# Patient Record
Sex: Female | Born: 1975 | Race: White | Hispanic: No | Smoking: Current some day smoker
Health system: Southern US, Community
[De-identification: ages and names within clinical notes are randomized; demographics above are authoritative.]

## PROBLEM LIST (undated history)

## (undated) DIAGNOSIS — J45909 Unspecified asthma, uncomplicated: Secondary | ICD-10-CM

## (undated) DIAGNOSIS — F419 Anxiety disorder, unspecified: Secondary | ICD-10-CM

## (undated) HISTORY — PX: ABDOMINAL HYSTERECTOMY: SHX81

---

## 1997-10-25 ENCOUNTER — Inpatient Hospital Stay (HOSPITAL_COMMUNITY): Admission: AD | Admit: 1997-10-25 | Discharge: 1997-10-25 | Payer: Self-pay | Admitting: Family Medicine

## 1997-12-14 ENCOUNTER — Inpatient Hospital Stay (HOSPITAL_COMMUNITY): Admission: AD | Admit: 1997-12-14 | Discharge: 1997-12-16 | Payer: Self-pay | Admitting: Family Medicine

## 1999-02-07 ENCOUNTER — Other Ambulatory Visit: Admission: RE | Admit: 1999-02-07 | Discharge: 1999-02-07 | Payer: Self-pay | Admitting: Family Medicine

## 2000-02-27 ENCOUNTER — Other Ambulatory Visit: Admission: RE | Admit: 2000-02-27 | Discharge: 2000-02-27 | Payer: Self-pay | Admitting: Family Medicine

## 2000-03-09 ENCOUNTER — Other Ambulatory Visit: Admission: RE | Admit: 2000-03-09 | Discharge: 2000-03-09 | Payer: Self-pay | Admitting: *Deleted

## 2000-03-09 ENCOUNTER — Encounter (INDEPENDENT_AMBULATORY_CARE_PROVIDER_SITE_OTHER): Payer: Self-pay

## 2000-03-25 ENCOUNTER — Ambulatory Visit (HOSPITAL_COMMUNITY): Admission: RE | Admit: 2000-03-25 | Discharge: 2000-03-25 | Payer: Self-pay | Admitting: *Deleted

## 2000-03-25 ENCOUNTER — Encounter (INDEPENDENT_AMBULATORY_CARE_PROVIDER_SITE_OTHER): Payer: Self-pay

## 2000-04-19 ENCOUNTER — Inpatient Hospital Stay (HOSPITAL_COMMUNITY): Admission: AD | Admit: 2000-04-19 | Discharge: 2000-04-19 | Payer: Self-pay | Admitting: *Deleted

## 2005-07-01 ENCOUNTER — Ambulatory Visit: Payer: Self-pay | Admitting: Family Medicine

## 2005-09-14 ENCOUNTER — Ambulatory Visit: Payer: Self-pay | Admitting: Family Medicine

## 2006-03-12 ENCOUNTER — Ambulatory Visit: Payer: Self-pay | Admitting: Family Medicine

## 2007-04-19 ENCOUNTER — Encounter: Admission: RE | Admit: 2007-04-19 | Discharge: 2007-04-19 | Payer: Self-pay | Admitting: Obstetrics & Gynecology

## 2011-01-09 NOTE — Op Note (Signed)
[facility]  Patient:    XOIE, KREUSER                      MRN: [id number] Proc. Date: 03/25/00 Adm. Date:  [id number] Disc. Date: [id number] Attending:  Pleas Koch CC:         Gweneth Dimitri, M.D. at [facility]                           Operative Report  PREOPERATIVE DIAGNOSIS:       High grade cervical dysplasia.  POSTOPERATIVE DIAGNOSIS:      High grade cervical dysplasia.  OPERATION:                    LEEP with extra-surgical excision procedure.  SURGEON:                      Georgina Peer, M.D.  ANESTHESIA:                   General.  COMPLICATIONS:                None.  ESTIMATED BLOOD LOSS:         Less than 20 cc.  INDICATIONS:                  A 35 year old gravida 3, para 2, with abnormal Pap smear showing suggested high grade dysplasia.  High grade dysplasia was found on colposcopy and biopsy, and the patient is presenting for treatment with LEEP procedure.  DESCRIPTION OF PROCEDURE:     The patient was taken to the operating room and chose general anesthetic.  She was given a general anesthetic and placed in the dorsolithotomy position.  The cervix was visualized, ________ areas at 3 and 6 previously identified in the office were seen in their similar location.  A total of 4 cc of 2% Xylocaine with epinephrine, 1:100,000 was injected intracervically, the cervix blanched.  A ________ with the setting of 100 watts cutting, 60 watts coag was used to excise the lesion in transformation zone.  A ______ cautery was used to cauterize the edges and the specimen was marked appropriately and sent to pathology.                                The patient was awakened and transferred to recovery in good condition.  Sponge, needle, and instrument counts were correct. DD:  03/25/00 TD:  03/27/00 Job: [id number]

## 2013-12-13 ENCOUNTER — Emergency Department (HOSPITAL_COMMUNITY): Payer: BC Managed Care – PPO

## 2013-12-13 ENCOUNTER — Encounter (HOSPITAL_COMMUNITY): Payer: Self-pay | Admitting: Emergency Medicine

## 2013-12-13 ENCOUNTER — Emergency Department (HOSPITAL_COMMUNITY)
Admission: EM | Admit: 2013-12-13 | Discharge: 2013-12-13 | Disposition: A | Discharge status: Home / Self Care | Payer: BC Managed Care – PPO | Attending: Emergency Medicine | Admitting: Emergency Medicine

## 2013-12-13 DIAGNOSIS — IMO0002 Reserved for concepts with insufficient information to code with codable children: Secondary | ICD-10-CM | POA: Insufficient documentation

## 2013-12-13 DIAGNOSIS — Z8659 Personal history of other mental and behavioral disorders: Secondary | ICD-10-CM | POA: Diagnosis not present

## 2013-12-13 DIAGNOSIS — Y9389 Activity, other specified: Secondary | ICD-10-CM | POA: Diagnosis not present

## 2013-12-13 DIAGNOSIS — Y92410 Unspecified street and highway as the place of occurrence of the external cause: Secondary | ICD-10-CM | POA: Insufficient documentation

## 2013-12-13 DIAGNOSIS — F17200 Nicotine dependence, unspecified, uncomplicated: Secondary | ICD-10-CM | POA: Diagnosis not present

## 2013-12-13 DIAGNOSIS — J45909 Unspecified asthma, uncomplicated: Secondary | ICD-10-CM | POA: Insufficient documentation

## 2013-12-13 DIAGNOSIS — S0993XA Unspecified injury of face, initial encounter: Secondary | ICD-10-CM | POA: Diagnosis not present

## 2013-12-13 DIAGNOSIS — S199XXA Unspecified injury of neck, initial encounter: Secondary | ICD-10-CM

## 2013-12-13 HISTORY — DX: Anxiety disorder, unspecified: F41.9

## 2013-12-13 HISTORY — DX: Unspecified asthma, uncomplicated: J45.909
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
## 2013-12-13 MED ORDER — IBUPROFEN 800 MG PO TABS
800.0000 mg | ORAL_TABLET | Freq: Three times a day (TID) | ORAL | Status: AC
Start: 2013-12-13 — End: ?

## 2013-12-13 MED ORDER — HYDROCODONE-ACETAMINOPHEN 5-325 MG PO TABS: 1.0000 | ORAL_TABLET | Freq: Four times a day (QID) | ORAL | Status: DC | PRN

## 2013-12-13 MED ORDER — IBUPROFEN 800 MG PO TABS
800.0000 mg | ORAL_TABLET | Freq: Once | ORAL | Status: AC
  Administered 2013-12-13: 800 mg via ORAL
  Filled 2013-12-13: qty 1

## 2013-12-13 MED ORDER — HYDROCODONE-ACETAMINOPHEN 5-325 MG PO TABS: 1.0000 | ORAL_TABLET | Freq: Once | ORAL | Status: DC

## 2013-12-13 MED ORDER — HYDROCODONE-ACETAMINOPHEN 5-325 MG PO TABS
1.0000 | ORAL_TABLET | Freq: Once | ORAL | Status: AC
  Administered 2013-12-13: 1 via ORAL
  Filled 2013-12-13: qty 1

## 2013-12-13 NOTE — ED Notes (Signed)
Pt reports pain 8/10.  Denies any relief from ibuprofen.  Pt request something stronger for pain.  Notified edp
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
## 2016-05-29 DIAGNOSIS — F17210 Nicotine dependence, cigarettes, uncomplicated: Secondary | ICD-10-CM | POA: Insufficient documentation

## 2017-06-06 DIAGNOSIS — M7502 Adhesive capsulitis of left shoulder: Secondary | ICD-10-CM | POA: Insufficient documentation

## 2019-06-27 DIAGNOSIS — G44229 Chronic tension-type headache, not intractable: Secondary | ICD-10-CM | POA: Insufficient documentation

## 2020-08-26 DIAGNOSIS — F4310 Post-traumatic stress disorder, unspecified: Secondary | ICD-10-CM | POA: Insufficient documentation

## 2020-10-07 DIAGNOSIS — M545 Low back pain, unspecified: Secondary | ICD-10-CM | POA: Insufficient documentation

## 2020-12-24 ENCOUNTER — Other Ambulatory Visit: Payer: Self-pay | Admitting: Orthopedic Surgery

## 2020-12-24 ENCOUNTER — Other Ambulatory Visit: Payer: Self-pay | Admitting: Family Medicine

## 2020-12-24 DIAGNOSIS — M5459 Other low back pain: Secondary | ICD-10-CM

## 2020-12-25 ENCOUNTER — Telehealth: Payer: Self-pay

## 2020-12-25 NOTE — Progress Notes (Signed)
Phone call to patient to discuss order for intradiscal injection. Reviewed pts allergies and most recent weight. Pt was advised not to take oral antibiotics for this procedure that we would give her IV antibiotics the day of her procedure. Discharge instructions also reviewed with patient and instructed patient to have a driver the day of the procedure. Pt verbalized understanding.

## 2020-12-26 ENCOUNTER — Other Ambulatory Visit: Payer: Self-pay | Admitting: Orthopedic Surgery

## 2020-12-26 ENCOUNTER — Ambulatory Visit
Admission: RE | Admit: 2020-12-26 | Discharge: 2020-12-26 | Disposition: A | Discharge status: Home / Self Care | Payer: Self-pay | Source: Ambulatory Visit | Attending: Orthopedic Surgery | Admitting: Orthopedic Surgery

## 2020-12-26 DIAGNOSIS — R52 Pain, unspecified: Secondary | ICD-10-CM

## 2020-12-27 ENCOUNTER — Ambulatory Visit
Admission: RE | Admit: 2020-12-27 | Discharge: 2020-12-27 | Disposition: A | Discharge status: Home / Self Care | Payer: Medicaid Other | Source: Ambulatory Visit | Attending: Orthopedic Surgery | Admitting: Orthopedic Surgery

## 2020-12-27 ENCOUNTER — Other Ambulatory Visit: Payer: Self-pay

## 2020-12-27 DIAGNOSIS — M5459 Other low back pain: Secondary | ICD-10-CM
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
## 2020-12-27 MED ORDER — IOPAMIDOL (ISOVUE-M 200) INJECTION 41%
1.0000 mL | Freq: Once | INTRAMUSCULAR | Status: AC
  Administered 2020-12-27: 1 mL

## 2020-12-27 MED ORDER — CEFAZOLIN SODIUM-DEXTROSE 2-4 GM/100ML-% IV SOLN
2.0000 g | INTRAVENOUS | Status: AC
  Administered 2020-12-27: 2 g via INTRAVENOUS

## 2020-12-27 NOTE — Discharge Instructions (Signed)

## 2021-02-06 ENCOUNTER — Other Ambulatory Visit: Payer: Self-pay

## 2021-02-06 ENCOUNTER — Ambulatory Visit: Payer: Medicaid Other | Attending: Orthopedic Surgery

## 2021-02-06 DIAGNOSIS — G8929 Other chronic pain: Secondary | ICD-10-CM | POA: Diagnosis present

## 2021-02-06 DIAGNOSIS — M545 Low back pain, unspecified: Secondary | ICD-10-CM | POA: Insufficient documentation

## 2021-02-06 DIAGNOSIS — M5416 Radiculopathy, lumbar region: Secondary | ICD-10-CM
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
## 2021-02-17 ENCOUNTER — Ambulatory Visit: Payer: Medicaid Other

## 2021-02-17 ENCOUNTER — Other Ambulatory Visit: Payer: Self-pay

## 2021-02-17 DIAGNOSIS — M5416 Radiculopathy, lumbar region: Secondary | ICD-10-CM

## 2021-02-17 DIAGNOSIS — M545 Low back pain, unspecified: Secondary | ICD-10-CM | POA: Diagnosis not present

## 2021-02-17 DIAGNOSIS — G8929 Other chronic pain: Secondary | ICD-10-CM

## 2021-02-17 NOTE — Therapy (Signed)
[facility] [address] [place], [place], [address] Phone: [phone]   Fax:  [phone]  Physical Therapy Treatment  Patient Details  Name: Jermeka Schlotterbeck MRN: [id number] Date of Birth: [birth year] Referring Provider (PT): Venita Lick   Encounter Date: 02/17/2021   PT End of Session - 02/17/21 0814     Visit Number 2    Number of Visits 8    Date for PT Re-Evaluation 03/06/21    PT Start Time 0734    PT Stop Time 0820    PT Time Calculation (min) 46 min    Activity Tolerance Patient tolerated treatment well;No increased pain    Behavior During Therapy WFL for tasks assessed/performed             Past Medical History:  Diagnosis Date   Anxiety    Asthma     Past Surgical History:  Procedure Laterality Date   ABDOMINAL HYSTERECTOMY      There were no vitals filed for this visit.   Subjective Assessment - 02/17/21 0740     Subjective COVID-19 screening performed upon arrival. She reports her back pain is an 8/10 today. She was given pain medication as she was in a lot o f pain last week.    Limitations Sitting;Walking;Standing    How long can you sit comfortably? 5 mins has to get up and walk around;    How long can you stand comfortably? 20 mins or less    How long can you walk comfortably? 30 mins    Patient Stated Goals To be able to sit and stand comfortably without pain    Currently in Pain? Yes    Pain Score 8     Pain Location Back                               [facility] Adult PT Treatment/Exercise - 02/17/21 0734       Exercises   Exercises Lumbar      Lumbar Exercises: Stretches   Passive Hamstring Stretch Limitations Supine with yofa streap 3 x 20 sec bil    Double Knee to Chest Stretch Limitations 5 x 10 sec holding at knees with hands    Lower Trunk Rotation Limitations 2 mins - pt reported improved ease of movement    Quadruped Mid Back Stretch Limitations child's pose to  thread the needle 5 x 10 sec    Prone Mid Back Stretch Limitations press up through hands 3 x 10 sec    Figure 4 Stretch Limitations supine push pull hold 10 sec; 10x each      Lumbar Exercises: Quadruped   Other Quadruped Lumbar Exercises primal push up - raised knees off of the mat x 10      Modalities   Modalities Moist Heat      Manual Therapy   Manual Therapy Joint mobilization;Soft tissue mobilization    Manual therapy comments reduced lumbopelvic restrictions    Joint Mobilization CPA gr II at lumbar spine, R innominate inf/lat mob gr III    Soft tissue mobilization QL STM, glut max/med STM, lumbar paraspinal STM                      PT Short Term Goals - 02/07/21 0941       PT SHORT TERM GOAL #1   Title Indep With HEP    Baseline Not knowledgeable about HEP  Time 1    Period Weeks    Status New    Target Date 02/13/21               PT Long Term Goals - 02/07/21 0942       PT LONG TERM GOAL #1   Title Patient will be indep with an advanced HEP    Baseline Initial HEP provided; indep to be assessed for STG.    Time 3    Period Weeks    Status New    Target Date 02/27/21      PT LONG TERM GOAL #2   Title Patient will be able to perform bed mobility with minimal difficulty.    Baseline Pain occurs and increased time needed for transfers    Time 4    Period Weeks    Status New    Target Date 03/06/21      PT LONG TERM GOAL #3   Title Patient will be able to sit greater than 30 mins without needing to change positions and without pain greater than 2/10    Baseline 10 mins pain 8/10    Time 4    Period Weeks    Status New    Target Date 03/06/21                   Plan - 02/17/21 0814     Clinical Impression Statement Patient participated well i n PT session today with improved mobility of the lumbopelvic region wihtout increased symptoms. She was able to perform HEP and indicate releief areas with activity. Plan to progres  score strength as care of episode continues.    Personal Factors and Comorbidities Past/Current Experience;Time since onset of injury/illness/exacerbation    Examination-Activity Limitations Bed Mobility;Stand;Lift;Sit    Examination-Participation Restrictions Driving;Community Activity    Stability/Clinical Decision Making Evolving/Moderate complexity    Clinical Decision Making Low    Rehab Potential Good    PT Frequency 2x / week    PT Duration 4 weeks    PT Treatment/Interventions Moist Heat;Ultrasound;Therapeutic activities;Neuromuscular re-education;Therapeutic exercise;Manual techniques;Joint Manipulations;Passive range of motion    PT Next Visit Plan core strength in quadruped, standing (multifidi pressout), pelvic clocks    Consulted and Agree with Plan of Care Patient             Patient will benefit from skilled therapeutic intervention in order to improve the following deficits and impairments:  Decreased activity tolerance, Decreased range of motion, Decreased strength, Hypomobility, Increased fascial restricitons, Increased muscle spasms, Pain, Decreased mobility  Visit Diagnosis: Chronic right-sided low back pain, unspecified whether sciatica present  Radiculopathy, lumbar region     Problem List There are no problems to display for this patient.   Levonne Spiller PT, DPT 02/17/2021, 8:22 AM  [facility] [address] [place], [place], [address] Phone: [phone]   Fax:  [phone]  Name: Catera Hankins MRN: [id number] Date of Birth: April 22, 1976

## 2021-02-19 ENCOUNTER — Ambulatory Visit: Payer: Medicaid Other

## 2021-02-19 ENCOUNTER — Other Ambulatory Visit: Payer: Self-pay

## 2021-02-19 DIAGNOSIS — M5416 Radiculopathy, lumbar region: Secondary | ICD-10-CM

## 2021-02-19 DIAGNOSIS — M545 Low back pain, unspecified: Secondary | ICD-10-CM | POA: Diagnosis not present

## 2021-02-19 DIAGNOSIS — G8929 Other chronic pain: Secondary | ICD-10-CM

## 2021-02-19 NOTE — Therapy (Signed)
[facility] [address] [place], [place], [address] Phone: [phone]   Fax:  [phone]  Physical Therapy Treatment  Patient Details  Name: Christina Murillo MRN: [id number] Date of Birth: 16-Feb-1976 Referring Provider (PT): Venita Lick   Encounter Date: 02/19/2021   PT End of Session - 02/19/21 0812     Visit Number 3    Number of Visits 8    Date for PT Re-Evaluation 03/06/21    PT Start Time 0732    Activity Tolerance Patient tolerated treatment well;No increased pain    Behavior During Therapy WFL for tasks assessed/performed             Past Medical History:  Diagnosis Date   Anxiety    Asthma     Past Surgical History:  Procedure Laterality Date   ABDOMINAL HYSTERECTOMY      There were no vitals filed for this visit.   Subjective Assessment - 02/19/21 0741     Subjective COVID-19 screening performed upon arrival. She states that she is sore today in the hip and low back. She states that none of the exercises she does at home are helping. She states that when she lays on her elbows she feels a little stretch but when she presses up through her hands all the way the strain is way too much.    Limitations Sitting;Walking;Standing    How long can you sit comfortably? 5 mins has to get up and walk around;    How long can you stand comfortably? 20 mins or less    How long can you walk comfortably? 30 mins    Patient Stated Goals To be able to sit and stand comfortably without pain    Currently in Pain? Yes    Pain Score 8     Pain Location Back    Pain Orientation Left    Pain Descriptors / Indicators Aching;Nagging    Pain Type Acute pain    Pain Radiating Towards Left hip    Pain Onset More than a month ago    Pain Frequency Several days a week    Aggravating Factors  prolonged walkng    Multiple Pain Sites Yes    Pain Score 8    Pain Location Hip    Pain Descriptors / Indicators Shooting                 [facility] Adult PT Treatment/Exercise - 02/19/21 0732       Exercises   Exercises Lumbar      Lumbar Exercises: Stretches   Other Lumbar Stretch Exercise standing hip flexor stretch with L leg retro placed on mat 5 x 10 sec holds    Other Lumbar Stretch Exercise hip flexor stretch - modified thomas at edge of plinth      Lumbar Exercises: Aerobic   Nustep 8 mins lvl 2.0      Lumbar Exercises: Standing   Other Standing Lumbar Exercises posterior pelvic tilt at the wall x 2 mins      Electrical Stimulation   Electrical Stimulation Location low back    Electrical Stimulation Action IFC    Electrical Stimulation Parameters 10.5 - 12  CV ; 15 mins    Electrical Stimulation Goals Pain               PT Short Term Goals - 02/07/21 0941       PT SHORT TERM GOAL #1   Title Indep With HEP  Baseline Not knowledgeable about HEP    Time 1    Period Weeks    Status New    Target Date 02/13/21               PT Long Term Goals - 02/07/21 0942       PT LONG TERM GOAL #1   Title Patient will be indep with an advanced HEP    Baseline Initial HEP provided; indep to be assessed for STG.    Time 3    Period Weeks    Status New    Target Date 02/27/21      PT LONG TERM GOAL #2   Title Patient will be able to perform bed mobility with minimal difficulty.    Baseline Pain occurs and increased time needed for transfers    Time 4    Period Weeks    Status New    Target Date 03/06/21      PT LONG TERM GOAL #3   Title Patient will be able to sit greater than 30 mins without needing to change positions and without pain greater than 2/10    Baseline 10 mins pain 8/10    Time 4    Period Weeks    Status New    Target Date 03/06/21                   Plan - 02/19/21 1610     Clinical Impression Statement Patient participates well in PT with improvement in lumbopelvoc mobility noted with pelvic tilts on physioball. Patient reports pain centralizes to center  low back and reduced lateral hip pain noted with standing hip flexor stretch. Provided HEP to encourage hip mobility within pain fre ranges. Skilled PT recommended to continue per plan of care.    Personal Factors and Comorbidities Past/Current Experience;Time since onset of injury/illness/exacerbation    Examination-Participation Restrictions Driving;Community Activity    Stability/Clinical Decision Making Evolving/Moderate complexity    Clinical Decision Making Low    Rehab Potential Good    PT Frequency 2x / week    PT Duration 4 weeks    PT Treatment/Interventions Moist Heat;Ultrasound;Therapeutic activities;Neuromuscular re-education;Therapeutic exercise;Manual techniques;Joint Manipulations;Passive range of motion;Electrical Stimulation    PT Next Visit Plan core strength in quadruped, standing (multifidi pressout), pelvic clocks; modalities as needed    Consulted and Agree with Plan of Care Patient             Patient will benefit from skilled therapeutic intervention in order to improve the following deficits and impairments:  Decreased activity tolerance, Decreased range of motion, Decreased strength, Hypomobility, Increased fascial restricitons, Increased muscle spasms, Pain, Decreased mobility  Visit Diagnosis: Chronic right-sided low back pain, unspecified whether sciatica present  Radiculopathy, lumbar region     Problem List There are no problems to display for this patient.   Levonne Spiller PT, DPT  02/19/2021, 9:00 AM  [facility] [address] [place], [place], [address] Phone: [phone]   Fax:  [phone]  Name: Christina Murillo MRN: [id number] Date of Birth: [birth year]

## 2021-02-25 ENCOUNTER — Other Ambulatory Visit: Payer: Self-pay | Admitting: Orthopedic Surgery

## 2021-02-25 DIAGNOSIS — M545 Low back pain, unspecified: Secondary | ICD-10-CM

## 2021-02-26 ENCOUNTER — Ambulatory Visit: Payer: Medicaid Other | Attending: Orthopedic Surgery

## 2021-02-26 ENCOUNTER — Other Ambulatory Visit: Payer: Self-pay

## 2021-02-26 DIAGNOSIS — M545 Low back pain, unspecified: Secondary | ICD-10-CM | POA: Insufficient documentation

## 2021-02-26 DIAGNOSIS — M5416 Radiculopathy, lumbar region: Secondary | ICD-10-CM | POA: Diagnosis present

## 2021-02-26 DIAGNOSIS — G8929 Other chronic pain: Secondary | ICD-10-CM | POA: Diagnosis present

## 2021-02-26 NOTE — Therapy (Signed)
[facility] [address] [place], [place], [address] Phone: [phone]   Fax:  [phone]  Physical Therapy Treatment  Patient Details  Name: Christina Murillo MRN: [id number] Date of Birth: 10/30/1975 Referring Provider (PT): Venita Lick   Encounter Date: 02/26/2021   PT End of Session - 02/26/21 0827     Visit Number 4    Number of Visits 8    Date for PT Re-Evaluation 03/06/21    PT Start Time 0736    PT Stop Time 0815    PT Time Calculation (min) 39 min    Activity Tolerance Patient tolerated treatment well;No increased pain    Behavior During Therapy WFL for tasks assessed/performed             Past Medical History:  Diagnosis Date   Anxiety    Asthma     Past Surgical History:  Procedure Laterality Date   ABDOMINAL HYSTERECTOMY      There were no vitals filed for this visit.   Subjective Assessment - 02/26/21 0751     Subjective COVID-19 screening performed upon arrival. She states that she is sore in the low back and L side - she has plans to receive injections but is awaiting a follow up call. She states that she has reecieved much relief with TENS last visit.    Limitations Sitting;Walking;Standing    How long can you sit comfortably? 5 mins has to get up and walk around;    Patient Stated Goals To be able to sit and stand comfortably without pain    Currently in Pain? Yes    Pain Score 7     Pain Location Back    Pain Orientation Left    Pain Descriptors / Indicators Sharp;Aching;Jabbing    Pain Radiating Towards Left hip anterior L leg    Pain Onset More than a month ago                               [facility] Adult PT Treatment/Exercise - 02/26/21 0737       Lumbar Exercises: Stretches   Other Lumbar Stretch Exercise physiobal LTR x 20 ; DKTC x 20;      Lumbar Exercises: Supine   Clam Limitations Single knee fall out x 20 each    Bent Knee Raise Limitations reverse curls  physioball lifts with LE in hooklying x 15    Large Ball Abdominal Isometric Limitations physioball DKTC with hip bridge x 15 to encourae core strength    Other Supine Lumbar Exercises PPT with feet elevated on physioball      Modalities   Modalities Electrical Stimulation      Electrical Stimulation   Electrical Stimulation Location Low Back    Electrical Stimulation Action IFC    Electrical Stimulation Parameters 80 - 150Hz  ; 40 % scan; 11                      PT Short Term Goals - 02/07/21 0941       PT SHORT TERM GOAL #1   Title Indep With HEP    Baseline Not knowledgeable about HEP    Time 1    Period Weeks    Status New    Target Date 02/13/21               PT Long Term Goals - 02/07/21 02/09/21  PT LONG TERM GOAL #1   Title Patient will be indep with an advanced HEP    Baseline Initial HEP provided; indep to be assessed for STG.    Time 3    Period Weeks    Status New    Target Date 02/27/21      PT LONG TERM GOAL #2   Title Patient will be able to perform bed mobility with minimal difficulty.    Baseline Pain occurs and increased time needed for transfers    Time 4    Period Weeks    Status New    Target Date 03/06/21      PT LONG TERM GOAL #3   Title Patient will be able to sit greater than 30 mins without needing to change positions and without pain greater than 2/10    Baseline 10 mins pain 8/10    Time 4    Period Weeks    Status New    Target Date 03/06/21                   Plan - 02/26/21 6213     Clinical Impression Statement Patient tolerates therapeutic interventions well with emphasis on pain modulation and mobility. Patient able to reduce hip and back pain at end of session with heat pad remaining at low back with supine activities. Skin assessed, normal response noted with modalities. Therapist provided written handout for patient to assess purchase of TENS unit for home. Skilled PT recommended to continue per  plan of care t oimprove sitting and standing tolerance.    Personal Factors and Comorbidities Past/Current Experience;Time since onset of injury/illness/exacerbation    Examination-Activity Limitations Bed Mobility;Stand;Lift;Sit    Examination-Participation Restrictions Driving;Community Activity    Stability/Clinical Decision Making Evolving/Moderate complexity    Clinical Decision Making Low    Rehab Potential Good    PT Frequency 2x / week    PT Duration 4 weeks    PT Treatment/Interventions Moist Heat;Ultrasound;Therapeutic activities;Neuromuscular re-education;Therapeutic exercise;Manual techniques;Joint Manipulations;Passive range of motion;Electrical Stimulation    PT Next Visit Plan core strength in quadruped, standing (multifidi pressout), pelvic clocks; modalities as needed    Consulted and Agree with Plan of Care Patient             Patient will benefit from skilled therapeutic intervention in order to improve the following deficits and impairments:  Decreased activity tolerance, Decreased range of motion, Decreased strength, Hypomobility, Increased fascial restricitons, Increased muscle spasms, Pain, Decreased mobility  Visit Diagnosis: Chronic right-sided low back pain, unspecified whether sciatica present  Radiculopathy, lumbar region     Problem List Patient Active Problem List   Diagnosis Date Noted   Low back pain 10/07/2020   PTSD (post-traumatic stress disorder) 08/26/2020   Chronic tension-type headache, not intractable 06/27/2019   Adhesive capsulitis of left shoulder 06/06/2017   Cigarette nicotine dependence without complication 05/29/2016   GAD (generalized anxiety disorder) 08/03/2013    Levonne Spiller PT, DPT 02/26/2021, 8:46 AM  [facility] [address] [place], [place], [address] Phone: [phone]   Fax:  [phone]  Name: Christina Murillo MRN: [id number] Date of Birth: 1976/02/19

## 2021-02-27 ENCOUNTER — Other Ambulatory Visit: Payer: Self-pay

## 2021-02-27 ENCOUNTER — Ambulatory Visit: Payer: Medicaid Other

## 2021-02-27 DIAGNOSIS — G8929 Other chronic pain: Secondary | ICD-10-CM

## 2021-02-27 DIAGNOSIS — M545 Low back pain, unspecified: Secondary | ICD-10-CM | POA: Diagnosis not present

## 2021-02-27 DIAGNOSIS — M5416 Radiculopathy, lumbar region: Secondary | ICD-10-CM

## 2021-02-27 NOTE — Therapy (Signed)
[facility] [address] [place], [place], [address] Phone: [phone]   Fax:  [phone]  Physical Therapy Treatment  Patient Details  Name: Christina Murillo MRN: [id number] Date of Birth: 11-08-1975 Referring Provider (PT): Venita Lick   Encounter Date: 02/27/2021   PT End of Session - 02/27/21 0900     Visit Number 5    Number of Visits 8    Date for PT Re-Evaluation 03/06/21    PT Start Time 0735   Patient late arrival   PT Stop Time 0816    PT Time Calculation (min) 41 min    Activity Tolerance Patient tolerated treatment well;No increased pain    Behavior During Therapy WFL for tasks assessed/performed             Past Medical History:  Diagnosis Date   Anxiety    Asthma     Past Surgical History:  Procedure Laterality Date   ABDOMINAL HYSTERECTOMY      There were no vitals filed for this visit.   Subjective Assessment - 02/27/21 0854     Subjective COVID-19 screening performed upon arrival. She states that she is sore today - the pain gets worse in the middle of the day. She felt better after last session.    Limitations Sitting;Walking;Standing    How long can you sit comfortably? 5 mins has to get up and walk around;    How long can you stand comfortably? 20 mins or less    How long can you walk comfortably? 30 mins    Patient Stated Goals To be able to sit and stand comfortably without pain    Currently in Pain? Yes    Pain Score 6     Pain Location Back                               [facility] Adult PT Treatment/Exercise - 02/27/21 0001       Exercises   Exercises Lumbar      Lumbar Exercises: Stretches   Active Hamstring Stretch 5 reps;10 seconds    Active Hamstring Stretch Limitations seated    Hip Flexor Stretch 5 reps;10 seconds    Hip Flexor Stretch Limitations standing with foot on chair extended    Pelvic Tilt 3 reps;10 seconds    Pelvic Tilt Limitations supine with  physical tactile cues from self    Standing Extension 10 reps    Standing Extension Limitations hips to wall with L UE reach overhead    Figure 4 Stretch 5 reps;20 seconds    Figure 4 Stretch Limitations seated    Other Lumbar Stretch Exercise LTR - x 2 mins with contra reach      Lumbar Exercises: Aerobic   Stationary Bike 10 mins with Estime placed at low back      Lumbar Exercises: Supine   AB Set Limitations knees to chest / reverse crunch      Modalities   Modalities Geologist, engineering Location Low Back    Electrical Stimulation Action TENS    Electrical Stimulation Parameters 2-4 Hz; ;    Electrical Stimulation Goals Pain      Manual Therapy   Manual Therapy Joint mobilization;Soft tissue mobilization    Manual therapy comments improve L sided hip mobility \    Joint Mobilization CPA gr II at lumbar spine, R innominate inf/lat mob  gr III    Soft tissue mobilization STM to QL and L lumbar paraspinal                      PT Short Term Goals - 02/07/21 0941       PT SHORT TERM GOAL #1   Title Indep With HEP    Baseline Not knowledgeable about HEP    Time 1    Period Weeks    Status New    Target Date 02/13/21               PT Long Term Goals - 02/07/21 0942       PT LONG TERM GOAL #1   Title Patient will be indep with an advanced HEP    Baseline Initial HEP provided; indep to be assessed for STG.    Time 3    Period Weeks    Status New    Target Date 02/27/21      PT LONG TERM GOAL #2   Title Patient will be able to perform bed mobility with minimal difficulty.    Baseline Pain occurs and increased time needed for transfers    Time 4    Period Weeks    Status New    Target Date 03/06/21      PT LONG TERM GOAL #3   Title Patient will be able to sit greater than 30 mins without needing to change positions and without pain greater than 2/10    Baseline 10 mins pain 8/10     Time 4    Period Weeks    Status New    Target Date 03/06/21                   Plan - 02/27/21 0901     Clinical Impression Statement Patient participates well in PT with benenfit of stretches of L hip flexor and lumbar region. Pain modulation with TENS unit provided today. Patient  educated on need to continue stretches provided for HEP. Skilled PT recommended to continue at this time.    Personal Factors and Comorbidities Past/Current Experience;Time since onset of injury/illness/exacerbation    Examination-Activity Limitations Bed Mobility;Stand;Lift;Sit    Examination-Participation Restrictions Driving;Community Activity    Stability/Clinical Decision Making Evolving/Moderate complexity    Rehab Potential Good    PT Frequency 2x / week    PT Duration 4 weeks    PT Treatment/Interventions Moist Heat;Ultrasound;Therapeutic activities;Neuromuscular re-education;Therapeutic exercise;Manual techniques;Joint Manipulations;Passive range of motion;Electrical Stimulation    PT Next Visit Plan core strength in quadruped, standing (multifidi pressout), pelvic clocks; modalities as needed             Patient will benefit from skilled therapeutic intervention in order to improve the following deficits and impairments:  Decreased activity tolerance, Decreased range of motion, Decreased strength, Hypomobility, Increased fascial restricitons, Increased muscle spasms, Pain, Decreased mobility  Visit Diagnosis: Chronic right-sided low back pain, unspecified whether sciatica present  Radiculopathy, lumbar region     Problem List Patient Active Problem List   Diagnosis Date Noted   Low back pain 10/07/2020   PTSD (post-traumatic stress disorder) 08/26/2020   Chronic tension-type headache, not intractable 06/27/2019   Adhesive capsulitis of left shoulder 06/06/2017   Cigarette nicotine dependence without complication 05/29/2016   GAD (generalized anxiety disorder) 08/03/2013     Levonne Spiller PT, DPT 02/27/2021, 9:11 AM  [facility] [address] [place], [place], [address] Phone: [phone]  Fax:  [phone]  Name: Christina Murillo MRN: [id number] Date of Birth: 1976/06/25

## 2021-03-03 ENCOUNTER — Ambulatory Visit: Payer: Medicaid Other | Admitting: Physical Therapy

## 2021-03-06 ENCOUNTER — Ambulatory Visit: Payer: Medicaid Other | Admitting: Physical Therapy

## 2021-03-06 ENCOUNTER — Other Ambulatory Visit: Payer: Self-pay

## 2021-03-06 DIAGNOSIS — M545 Low back pain, unspecified: Secondary | ICD-10-CM | POA: Diagnosis not present

## 2021-03-06 DIAGNOSIS — M5416 Radiculopathy, lumbar region: Secondary | ICD-10-CM

## 2021-03-06 DIAGNOSIS — G8929 Other chronic pain: Secondary | ICD-10-CM

## 2021-03-06 NOTE — Patient Instructions (Signed)
Pelvic Tilt: Posterior - Legs Bent (Supine)  Tighten stomach and flatten back by rolling pelvis down. Hold _10___ seconds. Relax. Repeat _10-30___ times per set. Do __2__ sets per session. Do _2___ sessions per day.   Bent Leg Lift (Hook-Lying)  Tighten stomach and slowly raise right leg _5___ inches from floor. Keep trunk rigid. Hold _3___ seconds. Repeat _10___ times per set. Do ___2-3_ sets per session. Do __2__ sessions per day.   Straight Leg Raise  Tighten stomach and slowly raise locked right leg __4__ inches from floor. Repeat __10-30__ times per set. Do __2__ sets per session. Do __2__ sessions per day.  Bridging  Slowly raise buttocks from floor, keeping stomach tight. Repeat _10___ times per set. Do __2__ sets per session. Do __2__ sessions per day.     Scapular Retraction: Bilateral     Standing lat pull with theraband  Anchor bands higher than your head. Start with your arms straight out in front of you at shoulder height (or a little above).  Pull bands down next to your body and then slowly return to the starting position.  10-30 x1day  Brushing Teeth    Place one foot on ledge and one hand on counter. Bend other knee slightly to keep back straight.  Copyright  VHI. All rights reserved.  Refrigerator   Squat with knees apart to reach lower shelves and drawers.   Copyright  VHI. All rights reserved.  Laundry [employer] down and hold basket close to stand. Use leg muscles to do the work.   Copyright  VHI. All rights reserved.  Housework - Vacuuming   Hold the vacuum with arm held at side. Step back and forth to move it, keeping head up. Avoid twisting.   Copyright  VHI. All rights reserved.  Housework - Wiping   Position yourself as close as possible to reach work surface. Avoid straining your back.   Copyright  VHI. All rights reserved.  Gardening - Mowing   Keep arms close to sides and walk with lawn  mower.   Copyright  VHI. All rights reserved.  Sleeping on Side   Place pillow between knees. Use cervical support under neck and a roll around waist as needed.   Copyright  VHI. All rights reserved.  Log Roll   Lying on back, bend left knee and place left arm across chest. Roll all in one movement to the right. Reverse to roll to the left. Always move as one unit.   Copyright  VHI. All rights reserved.  Stand to Sit / Sit to Stand   To sit: Bend knees to lower self onto front edge of chair, then scoot back on seat. To stand: Reverse sequence by placing one foot forward, and scoot to front of seat. Use rocking motion to stand up.  Copyright  VHI. All rights reserved.  Posture - Standing   Good posture is important. Avoid slouching and forward head thrust. Maintain curve in low back and align ears over shoul- ders, hips over ankles.   Copyright  VHI. All rights reserved.  Posture - Sitting   Sit upright, head facing forward. Try using a roll to support lower back. Keep shoulders relaxed, and avoid rounded back. Keep hips level with knees. Avoid crossing legs for long periods.   Copyright  VHI. All rights reserved.  Computer Work   Position work to Art gallery manager. Use proper work and seat height. Keep shoulders back and down, wrists straight, and elbows at right  angles. Use chair that provides full back support. Add footrest and lumbar roll as needed.   Copyright  VHI. All rights reserved.

## 2021-03-06 NOTE — Therapy (Signed)
[facility] [place], [place], [address] Phone: [phone]   Fax:  [phone]  Physical Therapy Treatment  Patient Details  Name: Christina Murillo MRN: [id number] Date of Birth: [birth year] Referring Provider (PT): Melina Schools   Encounter Date: 03/06/2021   PT End of Session - 03/06/21 0813     Visit Number 6    Number of Visits 8    Date for PT Re-Evaluation 03/06/21    PT Start Time 0735    PT Stop Time 0825    PT Time Calculation (min) 50 min    Activity Tolerance Patient tolerated treatment well;Patient limited by pain    Behavior During Therapy [facility] for tasks assessed/performed             Past Medical History:  Diagnosis Date   Anxiety    Asthma     Past Surgical History:  Procedure Laterality Date   ABDOMINAL HYSTERECTOMY      There were no vitals filed for this visit.   Subjective Assessment - 03/06/21 0738     Subjective COVID-19 screening performed upon arrival. Patient arrived with ongoing pain in low back at 9/10.    Limitations Sitting;Walking;Standing    How long can you sit comfortably? 5 mins has to get up and walk around;    How long can you stand comfortably? 20 mins or less    How long can you walk comfortably? 30 mins    Patient Stated Goals To be able to sit and stand comfortably without pain    Currently in Pain? Yes    Pain Score 9     Pain Location Back    Pain Orientation Lower;Left;Right   more left side   Pain Descriptors / Indicators Discomfort;Aching    Pain Radiating Towards lep hip and leg    Pain Onset More than a month ago    Aggravating Factors  prolong walking and activity    Pain Relieving Factors unsure of any relief                               [facility] Adult PT Treatment/Exercise - 03/06/21 0001       Self-Care   Self-Care ADL's;Lifting;Posture;Other Self-Care Comments    Other Self-Care Comments  HEP provided for all above      Lumbar  Exercises: Aerobic   Nustep L3 x10 UE/LE with bracing      Lumbar Exercises: Standing   Other Standing Lumbar Exercises demo for standing latt pul for HEP progression      Lumbar Exercises: Supine   Ab Set 20 reps;3 seconds    Glut Set 20 reps;3 seconds    Bent Knee Raise 3 seconds   2x10-15   Bridge 20 reps;3 seconds    Straight Leg Raise 3 seconds   2x10   Other Supine Lumbar Exercises clamshell with red band x20 w abdomninal bracing      Electrical Stimulation   Electrical Stimulation Location Low Back    Electrical Stimulation Action IFC    Electrical Stimulation Parameters 80-_0  x100mn    Electrical Stimulation Goals Pain                    PT Education - 03/06/21 0802     Education Details HEP progression    Person(s) Educated Patient    Methods Explanation;Demonstration;Handout    Comprehension Verbalized understanding;Returned demonstration  PT Short Term Goals - 03/06/21 0745       PT SHORT TERM GOAL #1   Title Indep With HEP    Baseline doing initial HEP    Time 1    Period Weeks    Status Achieved               PT Long Term Goals - 03/06/21 0746       PT LONG TERM GOAL #1   Title Patient will be indep with an advanced HEP    Baseline HEP provided today for progression 03/06/21    Time 3    Period Weeks    Status Achieved      PT LONG TERM GOAL #2   Title Patient will be able to perform bed mobility with minimal difficulty.    Baseline able to get up with minimal difficulty    Time 4    Period Weeks    Status Achieved      PT LONG TERM GOAL #3   Title Patient will be able to sit greater than 30 mins without needing to change positions and without pain greater than 2/10    Baseline currently 15-20 min 03/06/21    Time 4    Period Weeks    Status On-going                   Plan - 03/06/21 0816     Clinical Impression Statement Patient arrived with 9/10 pain today in low back esp left side with  radiating symptoms down hip and leg. Today educated patient on posture awareness techniques and abdominal bracing with HEP provided. Patient reported doing ok with initial HEP and today progressed with advanced along with posture techniques to help improve ADL's and movements to avoid any increased pain. Patient has a good understanding of abdominal bracing with progression to stabilize lumabr muscles. Patient continues to have high pain esp with prolong positions and activity. Patient met LTG #1 STG and LTG and LTG #2 with getting in/out of bed with minimal difficulty. Educated on logroll technique today. Remining goal ongoing due to pain deficts.    Personal Factors and Comorbidities Past/Current Experience;Time since onset of injury/illness/exacerbation    Examination-Activity Limitations Bed Mobility;Stand;Lift;Sit    Examination-Participation Restrictions Driving;Community Activity    Stability/Clinical Decision Making Evolving/Moderate complexity    Rehab Potential Good    PT Frequency 2x / week    PT Duration 4 weeks    PT Treatment/Interventions Moist Heat;Ultrasound;Therapeutic activities;Neuromuscular re-education;Therapeutic exercise;Manual techniques;Joint Manipulations;Passive range of motion;Electrical Stimulation    PT Next Visit Plan cont with POC for core strength in quadruped, standing (multifidi pressout), pelvic clocks; modalities as needed/going to MD for injections and may be put on hold pending his recommendation    Consulted and Agree with Plan of Care Patient             Patient will benefit from skilled therapeutic intervention in order to improve the following deficits and impairments:  Decreased activity tolerance, Decreased range of motion, Decreased strength, Hypomobility, Increased fascial restricitons, Increased muscle spasms, Pain, Decreased mobility  Visit Diagnosis: Chronic right-sided low back pain, unspecified whether sciatica present  Radiculopathy, lumbar  region     Problem List Patient Active Problem List   Diagnosis Date Noted   Low back pain 10/07/2020   PTSD (post-traumatic stress disorder) 08/26/2020   Chronic tension-type headache, not intractable 06/27/2019   Adhesive capsulitis of left shoulder 06/06/2017   Cigarette nicotine dependence without  complication 83/77/9396   GAD (generalized anxiety disorder) 08/03/2013    Phillips Climes, PTA 03/06/2021, 8:30 AM  [facility] [place], [place], [address] Phone: [phone]   Fax:  [phone]  Name: Anivea Velasques MRN: [id number] Date of Birth: [birth year]

## 2021-03-07 ENCOUNTER — Ambulatory Visit
Admission: RE | Admit: 2021-03-07 | Discharge: 2021-03-07 | Disposition: A | Discharge status: Home / Self Care | Payer: Medicaid Other | Source: Ambulatory Visit | Attending: Orthopedic Surgery | Admitting: Orthopedic Surgery

## 2021-03-07 DIAGNOSIS — M545 Low back pain, unspecified: Secondary | ICD-10-CM

## 2021-03-07 DIAGNOSIS — G8929 Other chronic pain: Secondary | ICD-10-CM

## 2021-03-07 IMAGING — XA DG DISKOGRAPHY LUMBAR S+I
2 series · 2 of 2 positions shown · non-contrast
Comparison: none

CLINICAL DATA: Transient relief after previous disc anesthetic
injection. Pain left greater than right. Repeat injection with
steroid requested.

EXAM:
LUMBAR L4-5 DISC INJECTION UNDER FLUOROSCOPY
TECHNIQUE: The procedure, risks (including but not limited to bleeding,
infection, organ damage ), benefits, and alternatives were explained
to the patient. Questions regarding the procedure were encouraged
and answered. The patient understands and consents to the procedure.

[Series 1: ortho standard · 1 of 1 slices shown (1 of 2)]
[im 1/1]
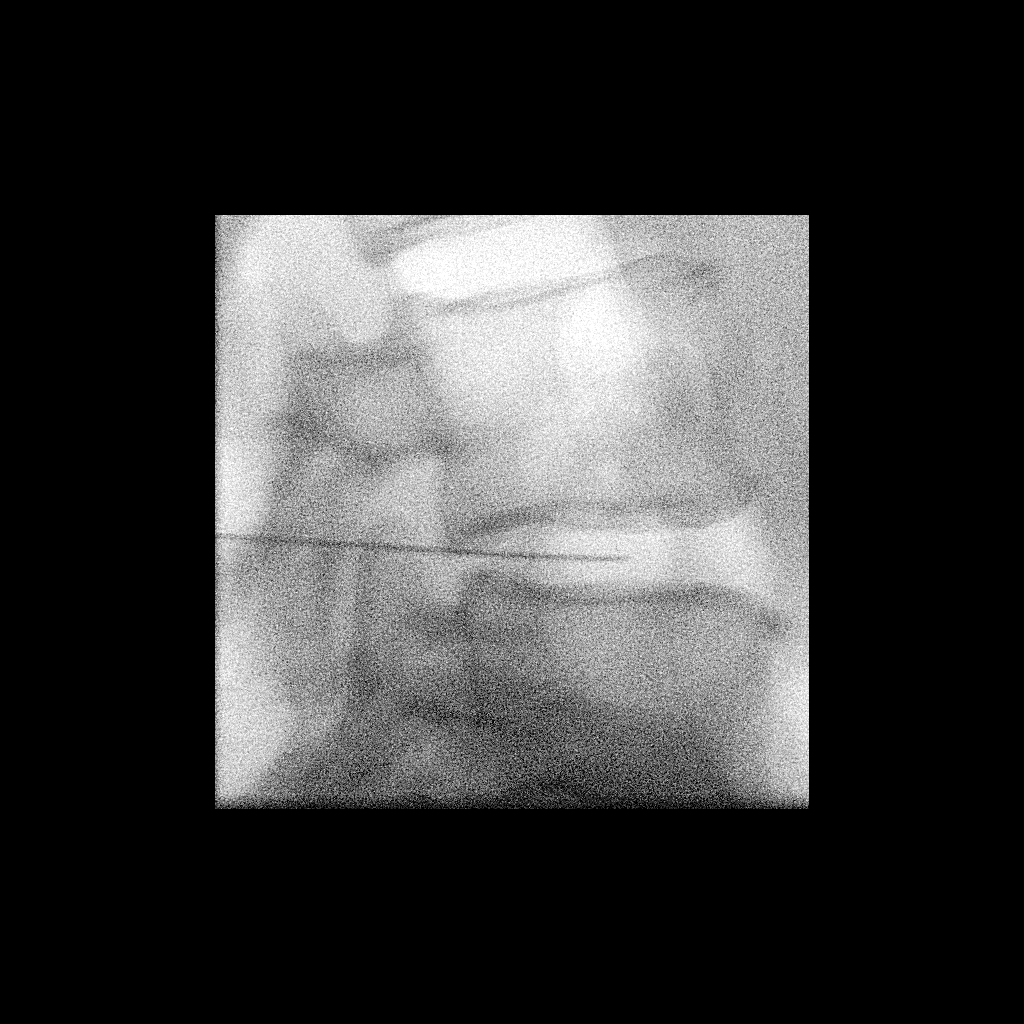

[Series 2: ortho standard · 1 of 1 slices shown (2 of 2)]
[im 1/1]
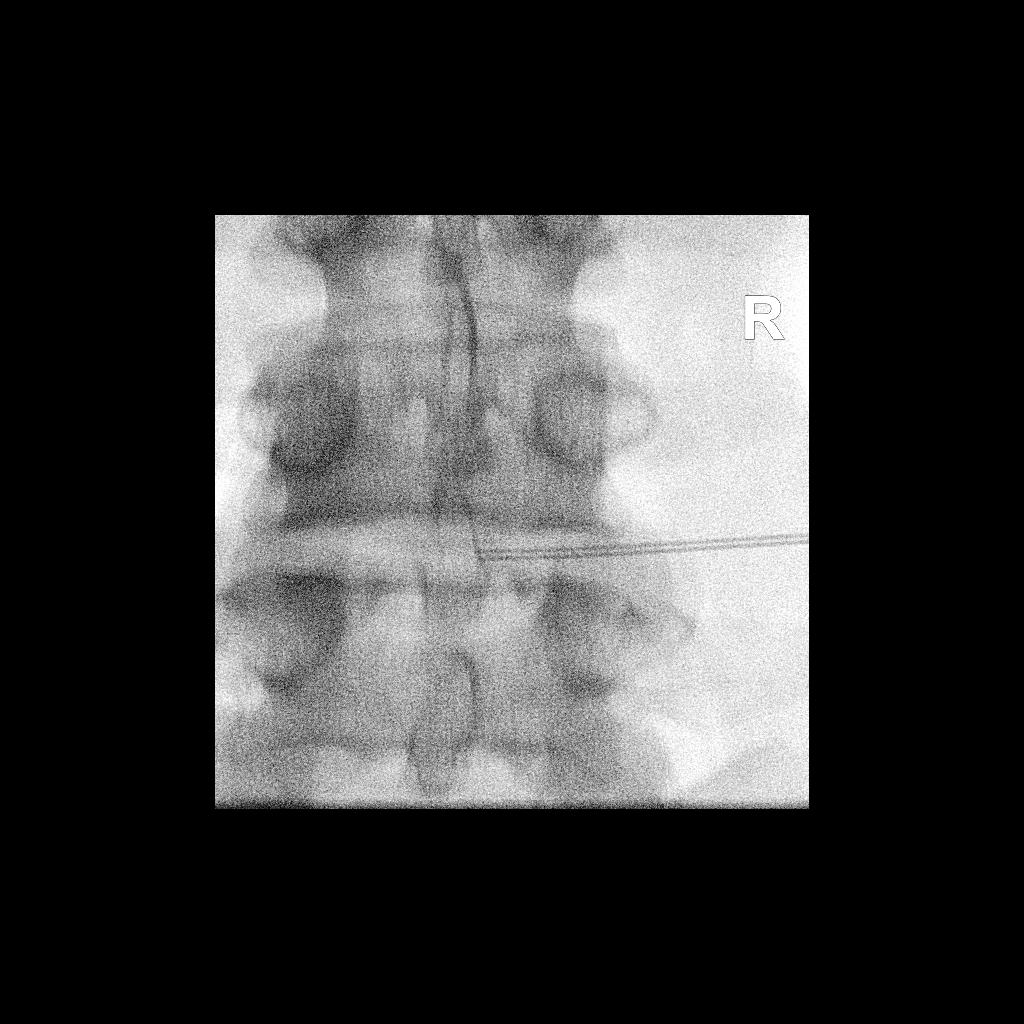

[2 of 2 positions shown; findings below may reference images not displayed]

As antibiotic prophylaxis, cefazolin 2 G was ordered pre-procedure
and administered intravenously within one hour of incision.

Lumbar region prepped with Betadine, draped in usual sterile
fashion, and infiltrated locally with 1% lidocaine.

Curved 22 gauge Chiba needle advanced into the L4-5 interspace using
a right posterolateral approach.

Lateral and AP images confirm needle tip position within the central
third of the interspace.

80 mg Depo-Medrol in 1mL Sensorcaine 0.5% was then administered ,
and the needle removed.

The patient tolerated the procedure well.

COMPLICATIONS:
None immediate
FINDINGS: Technically successful fluoroscopic guided disc injection into the
central third of the L4-5 interspace.
IMPRESSION: 1. Technically successful L4-5 disc injection under fluoroscopy.

## 2021-03-07 MED ORDER — METHYLPREDNISOLONE ACETATE 40 MG/ML INJ SUSP (RADIOLOG
80.0000 mg | Freq: Once | INTRAMUSCULAR | Status: AC
  Administered 2021-03-07: 80 mg via INTRALESIONAL

## 2021-03-07 MED ORDER — CEFAZOLIN SODIUM-DEXTROSE 2-4 GM/100ML-% IV SOLN
2.0000 g | INTRAVENOUS | Status: AC
  Administered 2021-03-07: 2 g via INTRAVENOUS

## 2021-03-07 MED ORDER — ONDANSETRON HCL 4 MG/2ML IJ SOLN
4.0000 mg | Freq: Once | INTRAMUSCULAR | Status: AC
  Administered 2021-03-07: 4 mg via INTRAVENOUS

## 2021-03-07 MED ORDER — FENTANYL CITRATE (PF) 100 MCG/2ML IJ SOLN
50.0000 ug | Freq: Once | INTRAMUSCULAR | Status: AC
  Administered 2021-03-07: 50 ug via INTRAVENOUS

## 2021-03-07 MED ORDER — IOPAMIDOL (ISOVUE-M 200) INJECTION 41%
1.0000 mL | Freq: Once | INTRAMUSCULAR | Status: AC
  Administered 2021-03-07: 1 mL

## 2021-03-07 NOTE — Discharge Instructions (Signed)
Intra-Discal Anesthetic Injection Discharge Instruction Sheet  You may resume a regular diet and any medications that you routinely take (including pain medications).  No driving day of procedure.  Light activity throughout the rest of the day.  Do not do any strenuous work, exercise, bending or lifting.  The day following the procedure, you can resume normal physical activity but you should refrain from exercising or physical therapy for at least three days thereafter.   Please contact our office at [phone] for the following symptoms: Fever greater than 100 degrees. Headaches unresolved with medication after 2-3 days. 

## 2021-03-07 NOTE — Discharge Instr - Other Orders (Addendum)
Pt c/o severe pain 10/10 after disc injection procedure. Dr. Deanne Coffer Aware. See MAR. Also, reclined pt back in chair with ice pack. Significant other at her side.   Pt reports some relief in her pain but not completely gone. Per Dr. Deanne Coffer, this pain will go away over the day today. Pt should go home, rest, and ice the site as needed. Pt/cg verbalizes understanding.

## 2021-03-10 ENCOUNTER — Ambulatory Visit: Payer: Medicaid Other | Admitting: Physical Therapy

## 2021-03-10 ENCOUNTER — Telehealth: Payer: Self-pay

## 2021-03-10 ENCOUNTER — Other Ambulatory Visit: Payer: Self-pay

## 2021-03-10 DIAGNOSIS — M5416 Radiculopathy, lumbar region: Secondary | ICD-10-CM

## 2021-03-10 DIAGNOSIS — M545 Low back pain, unspecified: Secondary | ICD-10-CM | POA: Diagnosis not present

## 2021-03-10 DIAGNOSIS — G8929 Other chronic pain: Secondary | ICD-10-CM

## 2021-03-10 NOTE — Therapy (Signed)
[facility] [address] [place], [place], [address] Phone: [phone]   Fax:  [phone]  Physical Therapy Treatment  Patient Details  Name: Christina Murillo MRN: [id number] Date of Birth: [birth year] Referring Provider (PT): Venita Lick   Encounter Date: 03/10/2021   PT End of Session - 03/10/21 0815     Visit Number 7    Number of Visits 8    Date for PT Re-Evaluation 03/06/21    PT Start Time 0735   time difference due to system down   PT Stop Time 0846    PT Time Calculation (min) 71 min    Activity Tolerance Patient limited by pain    Behavior During Therapy [facility] for tasks assessed/performed             Past Medical History:  Diagnosis Date   Anxiety    Asthma     Past Surgical History:  Procedure Laterality Date   ABDOMINAL HYSTERECTOMY      There were no vitals filed for this visit.   Subjective Assessment - 03/10/21 0806     Subjective COVID-19 screening performed upon arrival. Patient arrived 10/10 pain, after going to get a shot in back last friday. She reported instant pain after and during shot and no relief. The doctor's nurse told her to call her Dr. to get pain meds due to them not able to prescribe medication. She called Dr. Shon Baton office for advice and was given meds for pain relief by the nurse. Today no relief upon arrival and patient arrived with antalgic gait and slow pace movements. Patient can not put pressure on left side due to pain.    Limitations Sitting;Walking;Standing    How long can you sit comfortably? 5 mins has to get up and walk around;    How long can you stand comfortably? 20 mins or less    How long can you walk comfortably? 30 mins    Patient Stated Goals To be able to sit and stand comfortably without pain    Currently in Pain? Yes    Pain Score 10-Worst pain ever    Pain Location Back    Pain Orientation Left;Lower    Pain Descriptors / Indicators  Discomfort;Stabbing;Sore;Sharp;Shooting;Dull    Pain Type Chronic pain    Pain Radiating Towards left hip and leg    Pain Onset More than a month ago    Pain Frequency Constant    Aggravating Factors  any movement or pressure    Pain Relieving Factors nothing at this time                 [facility] Adult PT Treatment/Exercise - 03/10/21 0001       Moist Heat Therapy   Number Minutes Moist Heat 15 Minutes    Moist Heat Location Lumbar Spine   left     Electrical Stimulation   Electrical Stimulation Location Left low back    Electrical Stimulation Action IFC    Electrical Stimulation Parameters 80-150hz  x48min    Electrical Stimulation Goals Pain      Manual Therapy   Manual Therapy Soft tissue mobilization    Soft tissue mobilization PTA performed manual STW to left low back paraspinals, glut, piriformis and QL to reduce pain and tone, with light to medium pressure for total   PT performed hip lateral mobilization with belt gr II ( L hip); inferior LAD LLE gr II; posterior rotation of L Inominate in R sidelying, STM to QL (  deep pressure) PROM L hip IR/ER - she was able to improve ER following manual techniques -   Taught patient standing QL stretch at doorway post intervention.                      PT Short Term Goals - 03/06/21 0745       PT SHORT TERM GOAL #1   Title Indep With HEP    Baseline doing initial HEP    Time 1    Period Weeks    Status Achieved               PT Long Term Goals - 03/06/21 0746       PT LONG TERM GOAL #1   Title Patient will be indep with an advanced HEP    Baseline HEP provided today for progression 03/06/21    Time 3    Period Weeks    Status Achieved      PT LONG TERM GOAL #2   Title Patient will be able to perform bed mobility with minimal difficulty.    Baseline able to get up with minimal difficulty    Time 4    Period Weeks    Status Achieved      PT LONG TERM GOAL #3   Title Patient will be able  to sit greater than 30 mins without needing to change positions and without pain greater than 2/10    Baseline currently 15-20 min 03/06/21    Time 4    Period Weeks    Status On-going                   Plan - 03/10/21 0817     Clinical Impression Statement Patient arrived with 10/10 pain and unable to perfrom any activities today. Today focused on manual STW to left low back to reduce pain 24 min followed by heat and electrical stimulation. Palpable pain in left QL, and glut muscles with tightness in left paraspinals.  Patient felt some relief after. Discussed logroll and posture techniques for comfort. Post treatment PT assessed patient to assess and performed several stretches and mobilizations to help with pain in musles and relief about 25 min of manual. Patient walking with antalgic gait and 10/10 pain post treatment today.    Personal Factors and Comorbidities Past/Current Experience;Time since onset of injury/illness/exacerbation    Examination-Activity Limitations Bed Mobility;Stand;Lift;Sit    Examination-Participation Restrictions Driving;Community Activity    Stability/Clinical Decision Making Evolving/Moderate complexity    Rehab Potential Good    PT Frequency 2x / week    PT Duration 4 weeks    PT Treatment/Interventions Moist Heat;Ultrasound;Therapeutic activities;Neuromuscular re-education;Therapeutic exercise;Manual techniques;Joint Manipulations;Passive range of motion;Electrical Stimulation    PT Next Visit Plan cont with POC pending patients appt with F/U with Dr. Lovena Le and Agree with Plan of Care Patient             Patient will benefit from skilled therapeutic intervention in order to improve the following deficits and impairments:  Decreased activity tolerance, Decreased range of motion, Decreased strength, Hypomobility, Increased fascial restricitons, Increased muscle spasms, Pain, Decreased mobility  Visit Diagnosis: Chronic right-sided  low back pain, unspecified whether sciatica present  Radiculopathy, lumbar region     Problem List Patient Active Problem List   Diagnosis Date Noted   Low back pain 10/07/2020   PTSD (post-traumatic stress disorder) 08/26/2020   Chronic tension-type headache, not intractable 06/27/2019   Adhesive  capsulitis of left shoulder 06/06/2017   Cigarette nicotine dependence without complication 05/29/2016   GAD (generalized anxiety disorder) 08/03/2013   Cathie Hoops, PTA 03/10/21 9:15 AM   [facility] [address] [place], [place], [address] Phone: [phone]   Fax:  [phone]  Name: Lilla Callejo MRN: [id number] Date of Birth: [birth year]

## 2021-03-10 NOTE — Therapy (Signed)
[facility] [address] [place], [place], [address] Phone: [phone]   Fax:  [phone]  Physical Therapy Treatment  Patient Details  Name: Christina Murillo MRN: [id number] Date of Birth: Jan 02, 1976 Referring Provider (PT): Venita Lick   Encounter Date: 03/10/2021   PT End of Session - 03/10/21 0815     Visit Number 7    Number of Visits 8    Date for PT Re-Evaluation 03/06/21    PT Start Time 0735   time difference due to system down   PT Stop Time 0818    PT Time Calculation (min) 43 min    Activity Tolerance Patient limited by pain    Behavior During Therapy [facility] for tasks assessed/performed             Past Medical History:  Diagnosis Date   Anxiety    Asthma     Past Surgical History:  Procedure Laterality Date   ABDOMINAL HYSTERECTOMY      There were no vitals filed for this visit.   Subjective Assessment - 03/10/21 0806     Subjective COVID-19 screening performed upon arrival. Patient arrived 10/10 pain, after going to get a shot in back last friday. She reported instant pain after and during shot and no relief. The doctor's nurse told her to call her Dr. to get pain meds due to them not able to prescribe medication. She called Dr. Shon Baton office for advice and was given meds for pain relief by the nurse. Today no relief upon arrival and patient arrived with antalgic gait and slow pace movements. Patient can not put pressure on left side due to pain.    Limitations Sitting;Walking;Standing    How long can you sit comfortably? 5 mins has to get up and walk around;    How long can you stand comfortably? 20 mins or less    How long can you walk comfortably? 30 mins    Patient Stated Goals To be able to sit and stand comfortably without pain    Currently in Pain? Yes    Pain Score 10-Worst pain ever    Pain Location Back    Pain Orientation Left;Lower    Pain Descriptors / Indicators  Discomfort;Stabbing;Sore;Sharp;Shooting;Dull    Pain Type Chronic pain    Pain Radiating Towards left hip and leg    Pain Onset More than a month ago    Pain Frequency Constant    Aggravating Factors  any movement or pressure    Pain Relieving Factors nothing at this time                               [facility] PT Treatment/Exercise - 03/10/21 0001       Moist Heat Therapy   Number Minutes Moist Heat 15 Minutes    Moist Heat Location Lumbar Spine   left     Electrical Stimulation   Electrical Stimulation Location Left low back    Electrical Stimulation Action IFC    Electrical Stimulation Parameters 80-150hz  x3min    Electrical Stimulation Goals Pain      Manual Therapy   Manual Therapy Soft tissue mobilization    Soft tissue mobilization manual STW to left low back paraspinals, glut, piriformis to reduce pain and tone, with medium pressure for total                      PT  Short Term Goals - 03/06/21 0745       PT SHORT TERM GOAL #1   Title Indep With HEP    Baseline doing initial HEP    Time 1    Period Weeks    Status Achieved               PT Long Term Goals - 03/06/21 0746       PT LONG TERM GOAL #1   Title Patient will be indep with an advanced HEP    Baseline HEP provided today for progression 03/06/21    Time 3    Period Weeks    Status Achieved      PT LONG TERM GOAL #2   Title Patient will be able to perform bed mobility with minimal difficulty.    Baseline able to get up with minimal difficulty    Time 4    Period Weeks    Status Achieved      PT LONG TERM GOAL #3   Title Patient will be able to sit greater than 30 mins without needing to change positions and without pain greater than 2/10    Baseline currently 15-20 min 03/06/21    Time 4    Period Weeks    Status On-going                   Plan - 03/10/21 0817     Clinical Impression Statement Patient arrived with 10/10 pain and  unable to perfrom any activities today. Today focused on manual STW to left low back to reduce pain 24 min followed by heat and electrical stimulation. Palpable pain in left QL, and glut muscles with tightness in left paraspinals.  Patient felt some relief after. Discussed logroll and posture techniques for comfort. Post treatment PT assessed patient to assess and performed several stretches and mobilizations to help with pain in musles and relief about 20 min of manual. Patient walking with antalgic gait and 10/10 pain post treatment today.    Personal Factors and Comorbidities Past/Current Experience;Time since onset of injury/illness/exacerbation    Examination-Activity Limitations Bed Mobility;Stand;Lift;Sit    Examination-Participation Restrictions Driving;Community Activity    Stability/Clinical Decision Making Evolving/Moderate complexity    Rehab Potential Good    PT Frequency 2x / week    PT Duration 4 weeks    PT Treatment/Interventions Moist Heat;Ultrasound;Therapeutic activities;Neuromuscular re-education;Therapeutic exercise;Manual techniques;Joint Manipulations;Passive range of motion;Electrical Stimulation    PT Next Visit Plan cont with POC pending patients appt with F/U with Dr. Lovena Le and Agree with Plan of Care Patient             Patient will benefit from skilled therapeutic intervention in order to improve the following deficits and impairments:  Decreased activity tolerance, Decreased range of motion, Decreased strength, Hypomobility, Increased fascial restricitons, Increased muscle spasms, Pain, Decreased mobility  Visit Diagnosis: Chronic right-sided low back pain, unspecified whether sciatica present  Radiculopathy, lumbar region     Problem List Patient Active Problem List   Diagnosis Date Noted   Low back pain 10/07/2020   PTSD (post-traumatic stress disorder) 08/26/2020   Chronic tension-type headache, not intractable 06/27/2019   Adhesive  capsulitis of left shoulder 06/06/2017   Cigarette nicotine dependence without complication 05/29/2016   GAD (generalized anxiety disorder) 08/03/2013   Cathie Hoops, PTA 03/10/21 9:11 AM   [facility] [address] [place], [place], [address] Phone: [phone]   Fax:  [phone]  Name: Artisha Capri MRN: [id number] Date of Birth: 08-14-1976

## 2021-03-11 ENCOUNTER — Other Ambulatory Visit: Payer: Self-pay | Admitting: Orthopedic Surgery

## 2021-03-11 DIAGNOSIS — M545 Low back pain, unspecified: Secondary | ICD-10-CM

## 2021-03-12 ENCOUNTER — Ambulatory Visit
Admission: RE | Admit: 2021-03-12 | Discharge: 2021-03-12 | Disposition: A | Discharge status: Home / Self Care | Payer: Medicaid Other | Source: Ambulatory Visit | Attending: Orthopedic Surgery | Admitting: Orthopedic Surgery

## 2021-03-12 ENCOUNTER — Other Ambulatory Visit: Payer: Self-pay

## 2021-03-12 DIAGNOSIS — M545 Low back pain, unspecified: Secondary | ICD-10-CM

## 2021-03-12 IMAGING — MR MR LUMBAR SPINE W/O CM
4 of 5 series · 26 of 48 positions shown · non-contrast
Comparison: MRI lumbar spine [DATE].

CLINICAL DATA: Chronic left leg pain.

EXAM:
MRI LUMBAR SPINE WITHOUT CONTRAST
TECHNIQUE: Multiplanar, multisequence MR imaging of the lumbar spine was
performed. No intravenous contrast was administered.

[Series 3: T2 · sagittal · 4.0mm · 0.53mm/px · 6 of 16 slices shown (1 of 2)]
[im 1/16]
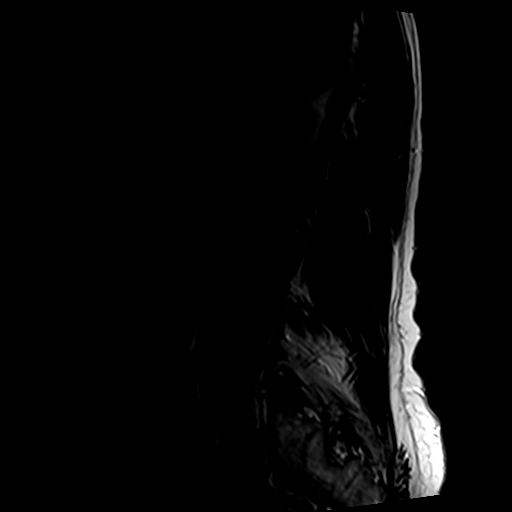
[im 4/16]
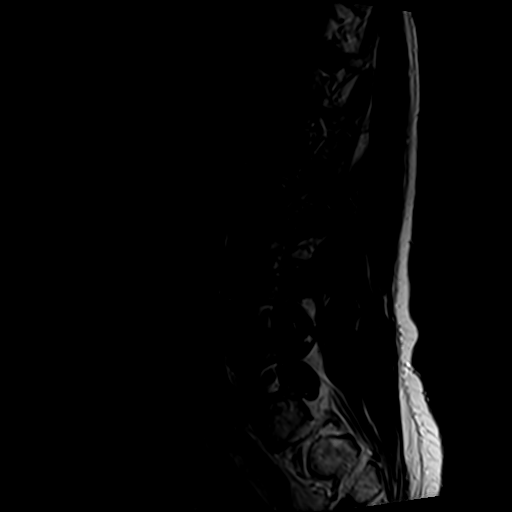
[im 7/16]
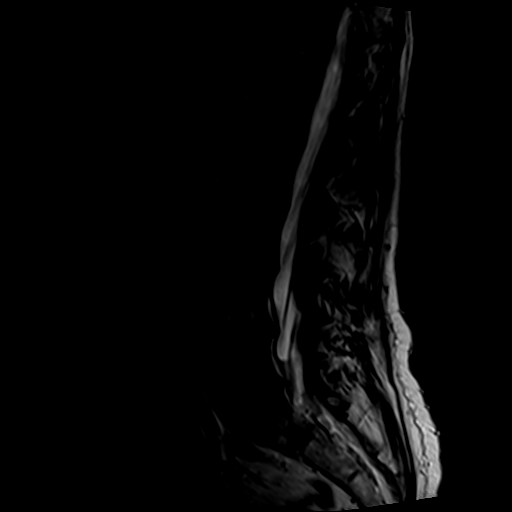
[im 10/16]
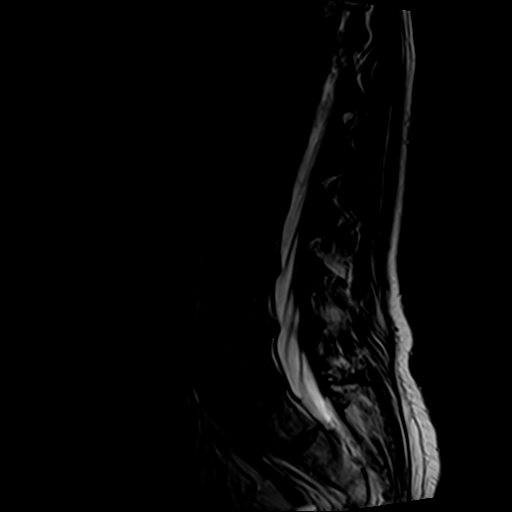
[im 13/16]
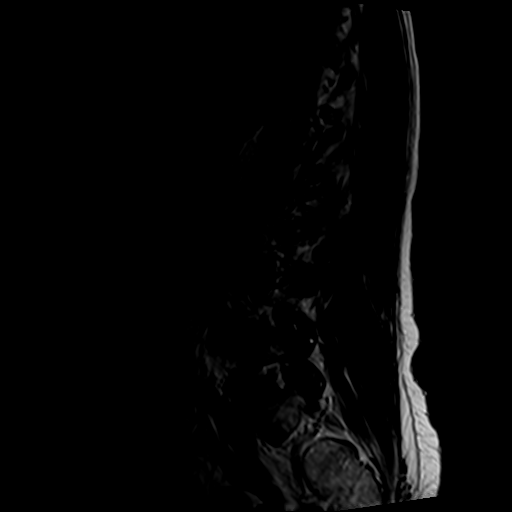
[im 16/16]
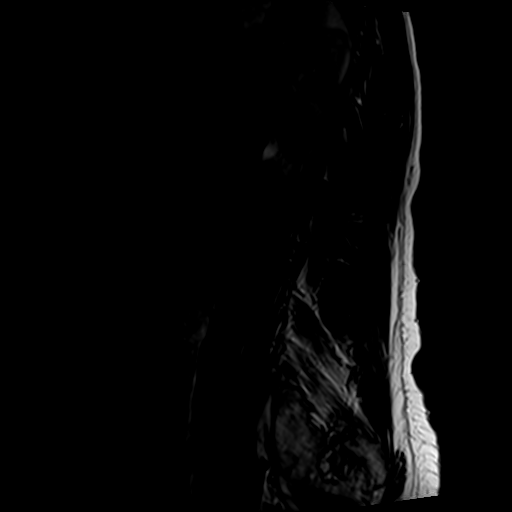

[Series 5: T1 · sagittal · 4.0mm · 0.53mm/px · 6 of 16 slices shown (1 of 2)]
[im 1/16]
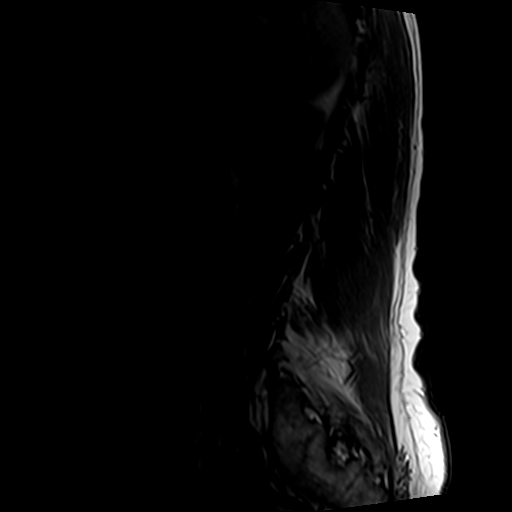
[im 4/16]
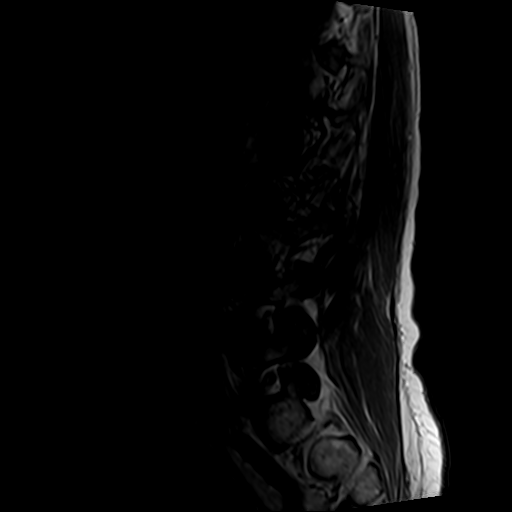
[im 7/16]
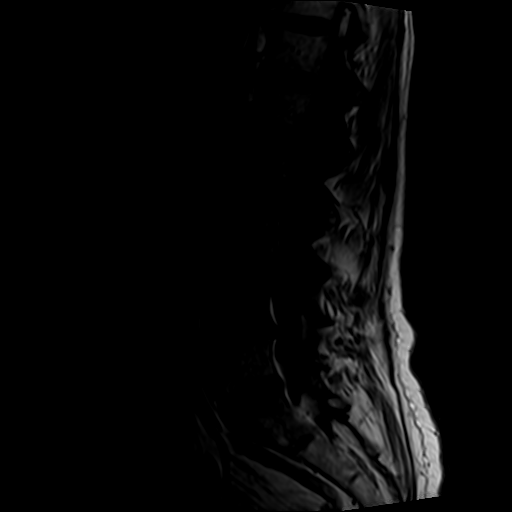
[im 10/16]
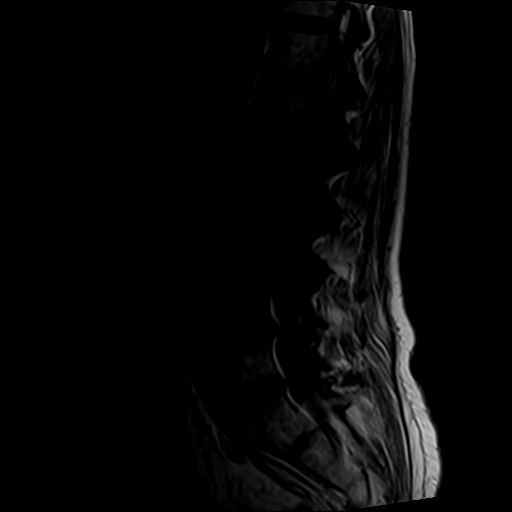
[im 13/16]
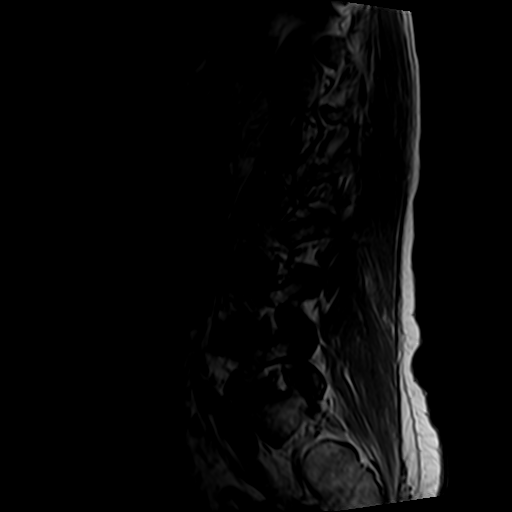
[im 16/16]
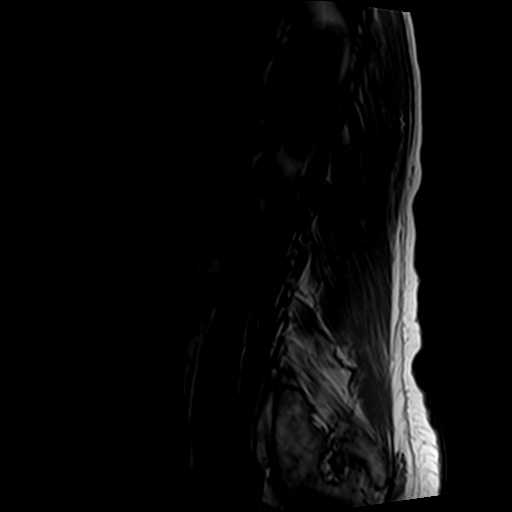

[Series 6: T2 · axial · 4.0mm · 0.70mm/px · z∈[-52,+164]mm · 9 of 41 slices shown (2 of 2)]
[im 1/41]
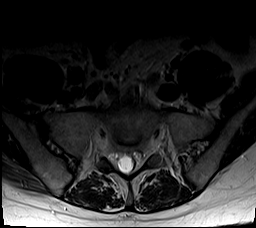
[im 6/41]
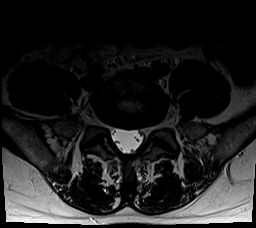
[im 12/41]
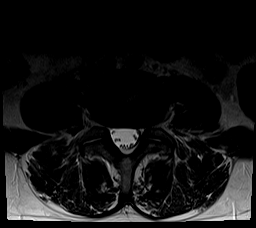
[im 18/41]
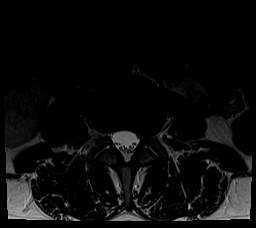
[im 21/41]
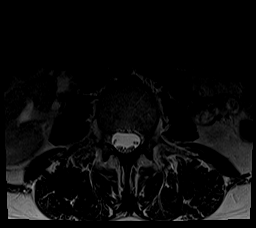
[im 23/41]
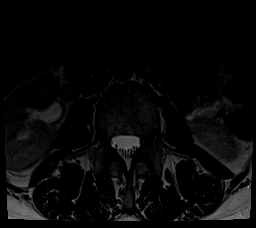
[im 29/41]
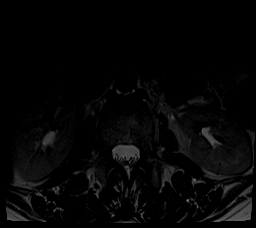
[im 35/41]
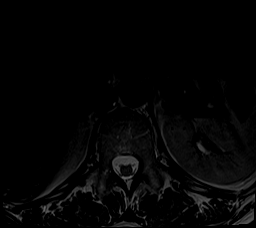
[im 41/41]
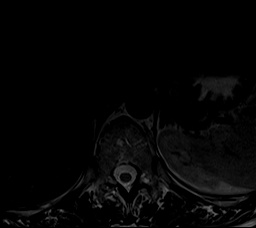

[Series 7: T1 · axial · 4.0mm · 0.35mm/px · z∈[-52,+133]mm · 5 of 41 slices shown (2 of 2)]
[im 1/41]
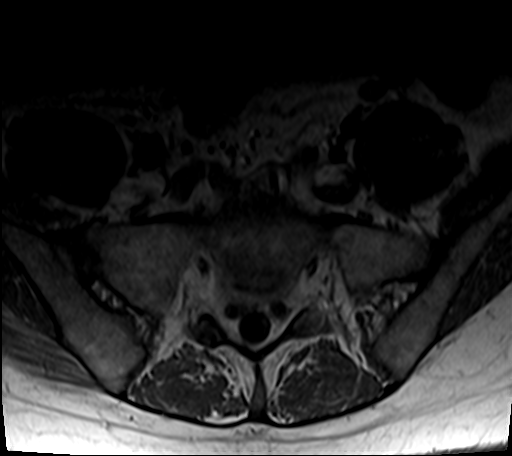
[im 6/41]
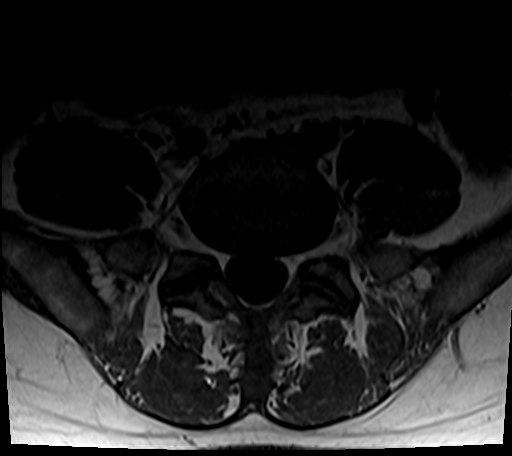
[im 12/41]
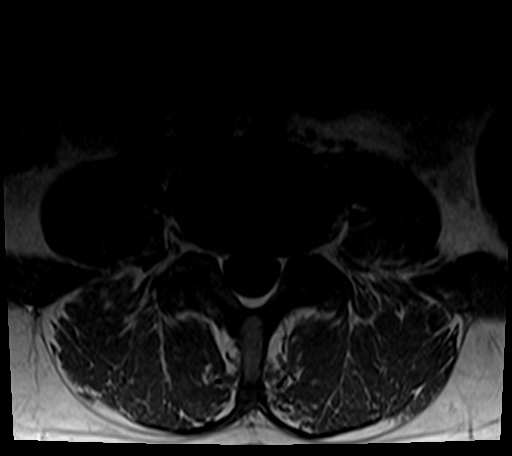
[im 21/41]
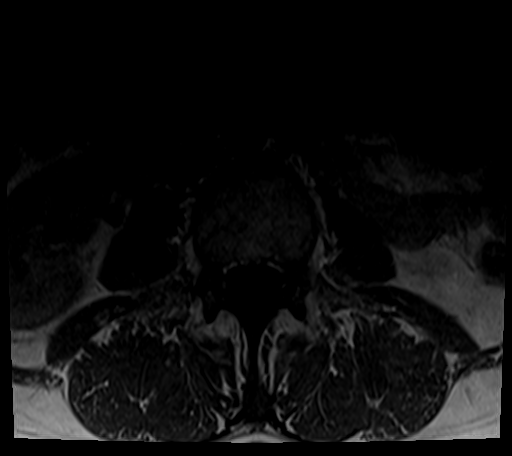
[im 35/41]
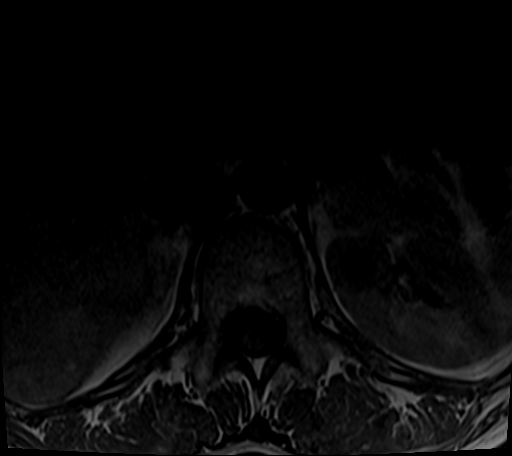

[26 of 48 positions shown; findings below may reference images not displayed]

FINDINGS: Segmentation:  Standard.

Alignment:  Normal.

Vertebrae: No fracture, evidence of discitis, or bone lesion. There
is some degenerative endplate signal change anteriorly at L4-5.

Conus medullaris and cauda equina: Conus extends to the L2 level.
Conus and cauda equina appear normal.

Paraspinal and other soft tissues: Negative.

Disc levels:

T11-12 to L2-3 are negative.

L3-4: A shallow left subarticular recess and foraminal protrusion.
There is minimal narrowing in the left lateral recess and mild to
moderate left foraminal narrowing. The right foramen is open.

L4-5: Loss of disc space height and a shallow broad-based disc
bulge. The central canal is widely patent. Moderate to moderately
severe foraminal narrowing is worse on the left.

L5-S1: Shallow disc bulge without stenosis.
IMPRESSION: No change in the appearance of the lumbar spine.

Shallow left subarticular recess and foraminal protrusion at L3-4
causes minimal narrowing in the left subarticular recess and mild to
moderate left foraminal narrowing.

Shallow broad-based disc bulge at L4-5 causes left worse than right
moderate to moderately severe foraminal narrowing. The central canal
is widely patent.

## 2021-03-13 ENCOUNTER — Ambulatory Visit: Payer: Medicaid Other | Admitting: Physical Therapy

## 2021-03-13 ENCOUNTER — Other Ambulatory Visit: Payer: Self-pay

## 2021-03-13 DIAGNOSIS — M545 Low back pain, unspecified: Secondary | ICD-10-CM | POA: Diagnosis not present

## 2021-03-13 DIAGNOSIS — G8929 Other chronic pain: Secondary | ICD-10-CM

## 2021-03-13 DIAGNOSIS — M5416 Radiculopathy, lumbar region: Secondary | ICD-10-CM

## 2021-03-13 NOTE — Therapy (Signed)
[facility] [place], [place], [address] Phone: [phone]   Fax:  [phone]  Physical Therapy Treatment  Patient Details  Name: Christina Murillo MRN: [id number] Date of Birth: 12/16/1975 Referring Provider (PT): Melina Schools   Encounter Date: 03/13/2021   PT End of Session - 03/13/21 0923     Visit Number 8    Number of Visits 8    Date for PT Re-Evaluation 03/06/21    PT Start Time 0737    PT Stop Time 0826    PT Time Calculation (min) 49 min    Activity Tolerance Patient limited by pain    Behavior During Therapy [facility] for tasks assessed/performed             Past Medical History:  Diagnosis Date   Anxiety    Asthma     Past Surgical History:  Procedure Laterality Date   ABDOMINAL HYSTERECTOMY      There were no vitals filed for this visit.   Subjective Assessment - 03/13/21 0744     Subjective COVID-19 screening performed upon arrival.    Limitations Sitting;Walking;Standing    How long can you sit comfortably? 5 mins has to get up and walk around;    How long can you stand comfortably? 20 mins or less    How long can you walk comfortably? 30 mins    Patient Stated Goals To be able to sit and stand comfortably without pain    Currently in Pain? Yes    Pain Score 10-Worst pain ever    Pain Location Back    Pain Orientation Left;Lower    Pain Descriptors / Indicators Discomfort;Stabbing;Sharp;Dull;Shooting    Pain Type Chronic pain    Pain Onset More than a month ago    Pain Frequency Constant    Aggravating Factors  any movement esp forward    Pain Relieving Factors nothing                               [facility] Adult PT Treatment/Exercise - 03/13/21 0001       Lumbar Exercises: Standing   Other Standing Lumbar Exercises standing lumbar ext x5 reps with minimal movements per PT recommendation      Lumbar Exercises: Prone   Other Prone Lumbar Exercises prone over pillows  for positioning and relief x 37mn      Moist Heat Therapy   Number Minutes Moist Heat 15 Minutes      Electrical Stimulation   Electrical Stimulation Location Left low back    Electrical Stimulation Action IFC    Electrical Stimulation Parameters 80-150hz x175m    Electrical Stimulation Goals Pain      Manual Therapy   Manual Therapy Soft tissue mobilization    Soft tissue mobilization manual STW to left low back paraspinals, glut, piriformis and QL to reduce pain and tone, with light to medium pressure                    PT Education - 03/13/21 0924     Education Details Patient educated on positioning for comfort as she is unable to put pressure on left side. Education on glut set and abdominal bracing with low level standing ext per PT recommendation.    Person(s) Educated Patient    Methods Explanation;Demonstration    Comprehension Verbalized understanding              PT  Short Term Goals - 03/06/21 0745       PT SHORT TERM GOAL #1   Title Indep With HEP    Baseline doing initial HEP    Time 1    Period Weeks    Status Achieved               PT Long Term Goals - 03/13/21 0746       PT LONG TERM GOAL #1   Title Patient will be indep with an advanced HEP    Baseline HEP provided today for progression 03/06/21    Time 3    Period Weeks    Status Achieved      PT LONG TERM GOAL #2   Title Patient will be able to perform bed mobility with minimal difficulty.    Baseline able to get up with minimal difficulty    Time 4    Period Weeks    Status Achieved      PT LONG TERM GOAL #3   Title Patient will be able to sit greater than 30 mins without needing to change positions and without pain greater than 2/10    Baseline Patient has had set back and limited with any standing over a few minutes 03/13/21    Time 4    Period Weeks    Status Not Met                   Plan - 03/13/21 0926     Clinical Impression Statement Patient  arrived with 10/10 pain in left low back with antalgic gait. Patient reported flare up ongoing after she has received the shot last week. Today educated patient on positioning for comfort and gentle standing ext per PT recommendation. Today focused on manual STW to left low back to decrease pain and tone followed by modalities. Patient reported some relief after with slow movements yet still unable toput pressure on left side. patient had MRI and is going to Dr. Nelva Bush for F/U appt to discusss MRI and further treatment recommendation.    Personal Factors and Comorbidities Past/Current Experience;Time since onset of injury/illness/exacerbation    Examination-Activity Limitations Bed Mobility;Stand;Lift;Sit    Examination-Participation Restrictions Driving;Community Activity    Stability/Clinical Decision Making Evolving/Moderate complexity    Rehab Potential Good    PT Frequency 2x / week    PT Duration 4 weeks    PT Treatment/Interventions Moist Heat;Ultrasound;Therapeutic activities;Neuromuscular re-education;Therapeutic exercise;Manual techniques;Joint Manipulations;Passive range of motion;Electrical Stimulation    PT Next Visit Plan cont with POC pending patients appt with F/U with Dr. Cyndie Mull and Agree with Plan of Care Patient             Patient will benefit from skilled therapeutic intervention in order to improve the following deficits and impairments:  Decreased activity tolerance, Decreased range of motion, Decreased strength, Hypomobility, Increased fascial restricitons, Increased muscle spasms, Pain, Decreased mobility      Visit Diagnosis: Chronic right-sided low back pain, unspecified whether sciatica present  Radiculopathy, lumbar region     Problem List Patient Active Problem List   Diagnosis Date Noted   Low back pain 10/07/2020   PTSD (post-traumatic stress disorder) 08/26/2020   Chronic tension-type headache, not intractable 06/27/2019   Adhesive  capsulitis of left shoulder 06/06/2017   Cigarette nicotine dependence without complication 29/93/7169   GAD (generalized anxiety disorder) 08/03/2013     Progress Note Reporting Period 02/19/21 to 03/13/21  See note below for Objective Data  and Assessment of Progress/Goals.   Plan at this time to hold on extending plan of care until follow up with MD confirms recommendations for continued PT.   Therapy screening examination/treatment/procedure(s) were performed by therapist assistant and as primary therapist I was immediately available for consultation/collaboration.  Billy Coast PT, DPT    Phillips Climes 03/13/2021, 9:33 AM  [facility] [address] [place], [place], [address] Phone: [phone]   Fax:  [phone]  Name: Christina Murillo MRN: [id number] Date of Birth: 08/28/1975

## 2021-03-13 NOTE — Therapy (Signed)
[facility] [place], [place], [address] Phone: [phone]   Fax:  [phone]  Physical Therapy Treatment  Patient Details  Name: Christina Murillo MRN: [id number] Date of Birth: [birth year] Referring Provider (PT): Christina Murillo   Encounter Date: 03/13/2021   PT End of Session - 03/13/21 0923     Visit Number 8    Number of Visits 8    Date for PT Re-Evaluation 03/06/21    PT Start Time 0737    PT Stop Time 0826    PT Time Calculation (min) 49 min    Activity Tolerance Patient limited by pain    Behavior During Therapy [facility] for tasks assessed/performed             Past Medical History:  Diagnosis Date   Anxiety    Asthma     Past Surgical History:  Procedure Laterality Date   ABDOMINAL HYSTERECTOMY      There were no vitals filed for this visit.   Subjective Assessment - 03/13/21 0744     Subjective COVID-19 screening performed upon arrival.    Limitations Sitting;Walking;Standing    How long can you sit comfortably? 5 mins has to get up and walk around;    How long can you stand comfortably? 20 mins or less    How long can you walk comfortably? 30 mins    Patient Stated Goals To be able to sit and stand comfortably without pain    Currently in Pain? Yes    Pain Score 10-Worst pain ever    Pain Location Back    Pain Orientation Left;Lower    Pain Descriptors / Indicators Discomfort;Stabbing;Sharp;Dull;Shooting    Pain Type Chronic pain    Pain Onset More than a month ago    Pain Frequency Constant    Aggravating Factors  any movement esp forward    Pain Relieving Factors nothing                               [facility] Adult PT Treatment/Exercise - 03/13/21 0001       Lumbar Exercises: Standing   Other Standing Lumbar Exercises standing lumbar ext x5 reps with minimal movements per PT recommendation      Lumbar Exercises: Prone   Other Prone Lumbar Exercises prone over pillows  for positioning and relief x 99mn      Moist Heat Therapy   Number Minutes Moist Heat 15 Minutes      Electrical Stimulation   Electrical Stimulation Location Left low back    Electrical Stimulation Action IFC    Electrical Stimulation Parameters 80-_0  x158m    Electrical Stimulation Goals Pain      Manual Therapy   Manual Therapy Soft tissue mobilization    Soft tissue mobilization manual STW to left low back paraspinals, glut, piriformis and QL to reduce pain and tone, with light to medium pressure                    PT Education - 03/13/21 0924     Education Details Patient educated on positioning for comfort as she is unable to put pressure on left side. Education on glut set and abdominal bracing with low level standing ext per PT recommendation.    Person(s) Educated Patient    Methods Explanation;Demonstration    Comprehension Verbalized understanding              PT  Short Term Goals - 03/06/21 0745       PT SHORT TERM GOAL #1   Title Indep With HEP    Baseline doing initial HEP    Time 1    Period Weeks    Status Achieved               PT Long Term Goals - 03/13/21 0746       PT LONG TERM GOAL #1   Title Patient will be indep with an advanced HEP    Baseline HEP provided today for progression 03/06/21    Time 3    Period Weeks    Status Achieved      PT LONG TERM GOAL #2   Title Patient will be able to perform bed mobility with minimal difficulty.    Baseline set back and painful with movements and positioning 03/13/21    Time 4    Period Weeks    Status Not Met      PT LONG TERM GOAL #3   Title Patient will be able to sit greater than 30 mins without needing to change positions and without pain greater than 2/10    Baseline Patient has had set back and limited with any standing over a few minutes 03/13/21    Time 4    Period Weeks    Status Not Met                   Plan - 03/13/21 0926     Clinical Impression  Statement Patient arrived with 10/10 pain in left low back with antalgic gait. Patient reported flare up ongoing after she has received the shot last week. Today educated patient on positioning for comfort and gentle standing ext per PT recommendation. Today focused on manual STW to left low back to decrease pain and tone followed by modalities. Patient reported some relief after with slow movements yet still unable toput pressure on left side. patient had MRI and is going to Dr. Nelva Murillo for F/U appt to discusss MRI and further treatment recommendation.    Personal Factors and Comorbidities Past/Current Experience;Time since onset of injury/illness/exacerbation    Examination-Activity Limitations Bed Mobility;Stand;Lift;Sit    Examination-Participation Restrictions Driving;Community Activity    Stability/Clinical Decision Making Evolving/Moderate complexity    Rehab Potential Good    PT Frequency 2x / week    PT Duration 4 weeks    PT Treatment/Interventions Moist Heat;Ultrasound;Therapeutic activities;Neuromuscular re-education;Therapeutic exercise;Manual techniques;Joint Manipulations;Passive range of motion;Electrical Stimulation    PT Next Visit Plan cont with POC pending patients appt with F/U with Dr. Cyndie Murillo and Agree with Plan of Care Patient             Patient will benefit from skilled therapeutic intervention in order to improve the following deficits and impairments:  Decreased activity tolerance, Decreased range of motion, Decreased strength, Hypomobility, Increased fascial restricitons, Increased muscle spasms, Pain, Decreased mobility  Visit Diagnosis: Chronic right-sided low back pain, unspecified whether sciatica present  Radiculopathy, lumbar region     Problem List Patient Active Problem List   Diagnosis Date Noted   Low back pain 10/07/2020   PTSD (post-traumatic stress disorder) 08/26/2020   Chronic tension-type headache, not intractable 06/27/2019    Adhesive capsulitis of left shoulder 06/06/2017   Cigarette nicotine dependence without complication 14/43/1540   GAD (generalized anxiety disorder) 08/03/2013    Christina Murillo, PTA 03/13/2021, 9:34 AM  [facility] [place]  [place], [place], [address] Phone: [phone]   Fax:  [phone]  Name: Christina Murillo MRN: [id number] Date of Birth: May 11, 1976

## 2021-03-17 ENCOUNTER — Ambulatory Visit: Payer: Medicaid Other

## 2021-03-17 ENCOUNTER — Other Ambulatory Visit: Payer: Self-pay

## 2021-03-17 NOTE — Therapy (Signed)
[facility] [address] [place], [place], [address] Phone: [phone]   Fax:  [phone]  Patient Details  Name: Christina Murillo MRN: [id number] Date of Birth: August 13, 1976 Referring Provider:  Venita Lick, MD  Encounter Date: 03/17/2021  PT Visit : No Charge   Christina Murillo arrived to PT clinic at 0736am o 03/17/21 with reports of worsened symptoms since recent spinal injection (03/07/21). She arrives with an antalgic gait and reports peripheral bruising down both legs that has worsened over the weekend without known cause. She reports haivng had significant constipation and limited tolerance to bed mobility. Due to change in status and exacerbation of symptoms - PT to be held until Christina Murillo has a follow upwith MD to clear her to participate.     Levonne Spiller PT, DPT 03/17/2021, 8:22 AM  [facility] [address] [place], [place], [address] Phone: [phone]   Fax:  [phone]

## 2021-03-20 ENCOUNTER — Encounter: Payer: Medicaid Other | Admitting: Physical Therapy

## 2021-04-29 ENCOUNTER — Other Ambulatory Visit: Payer: Self-pay | Admitting: Orthopedic Surgery

## 2021-04-30 ENCOUNTER — Other Ambulatory Visit: Payer: Self-pay | Admitting: Orthopedic Surgery

## 2021-04-30 DIAGNOSIS — M259 Joint disorder, unspecified: Secondary | ICD-10-CM

## 2021-08-06 ENCOUNTER — Other Ambulatory Visit: Payer: Self-pay | Admitting: Student

## 2021-08-06 DIAGNOSIS — M25552 Pain in left hip: Secondary | ICD-10-CM

## 2021-08-30 ENCOUNTER — Ambulatory Visit
Admission: RE | Admit: 2021-08-30 | Discharge: 2021-08-30 | Disposition: A | Discharge status: Home / Self Care | Payer: Medicaid Other | Source: Ambulatory Visit | Attending: Student | Admitting: Student

## 2021-08-30 ENCOUNTER — Other Ambulatory Visit: Payer: Self-pay

## 2021-08-30 DIAGNOSIS — M25552 Pain in left hip: Secondary | ICD-10-CM

## 2021-08-30 IMAGING — MR MR HIP*L* W/O CM
4 of 5 series · 31 of 40 positions shown · non-contrast
Comparison: None.

CLINICAL DATA: Left hip pain

EXAM:
MR OF THE LEFT HIP WITHOUT CONTRAST
TECHNIQUE: Multiplanar, multisequence MR imaging was performed. No intravenous
contrast was administered.

[Series 3: T1 · coronal · 4.0mm · 1.19mm/px · 8 of 24 slices shown]
[im 1/24]
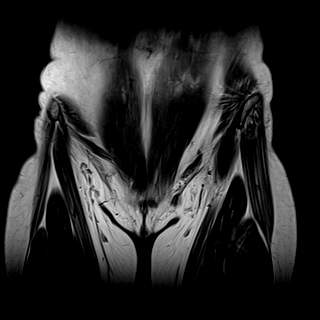
[im 4/24]
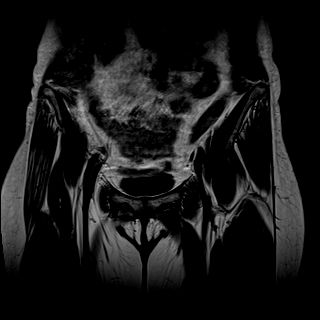
[im 7/24]
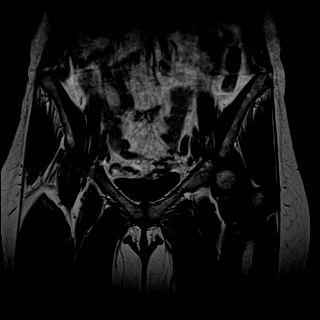
[im 10/24]
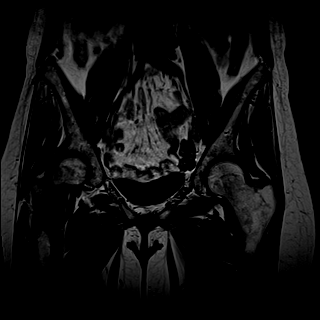
[im 14/24]
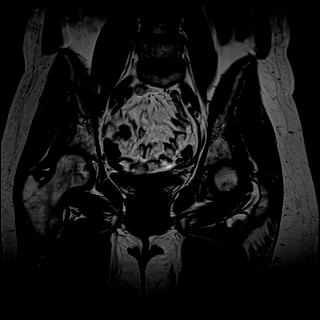
[im 17/24]
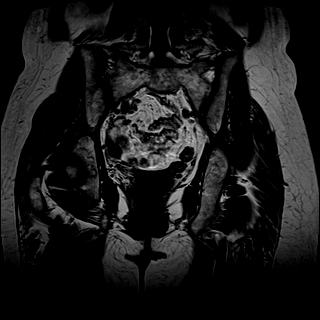
[im 20/24]
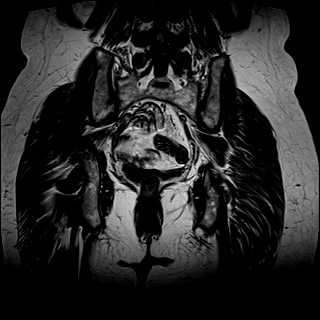
[im 24/24]
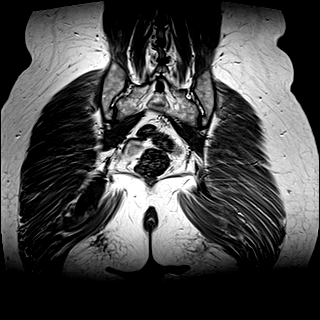

[Series 4: T2 fat-sat · coronal · 4.0mm · 1.19mm/px · 8 of 24 slices shown (1 of 2)]
[im 1/24]
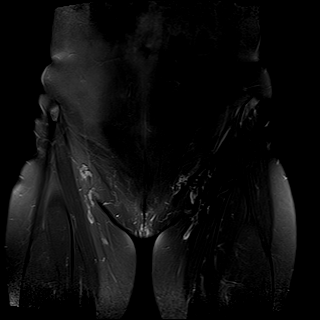
[im 4/24]
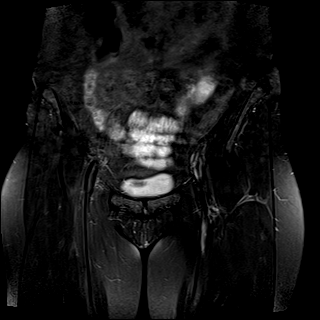
[im 7/24]
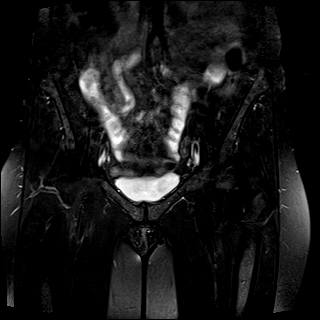
[im 10/24]
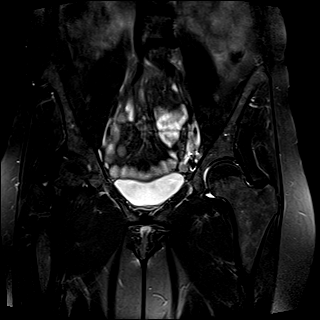
[im 14/24]
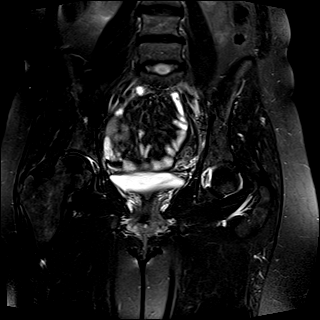
[im 17/24]
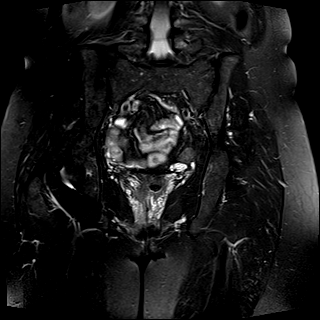
[im 20/24]
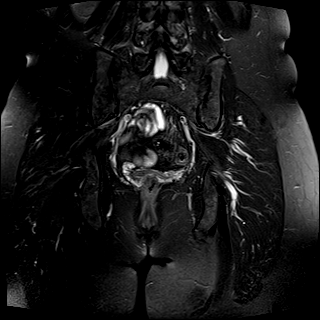
[im 24/24]
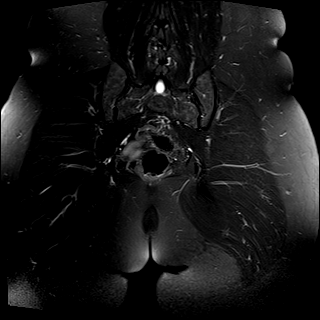

[Series 5: T2 fat-sat · axial · 4.0mm · 0.62mm/px · z∈[-118,+16]mm · 9 of 29 slices shown (2 of 2)]
[im 1/29]
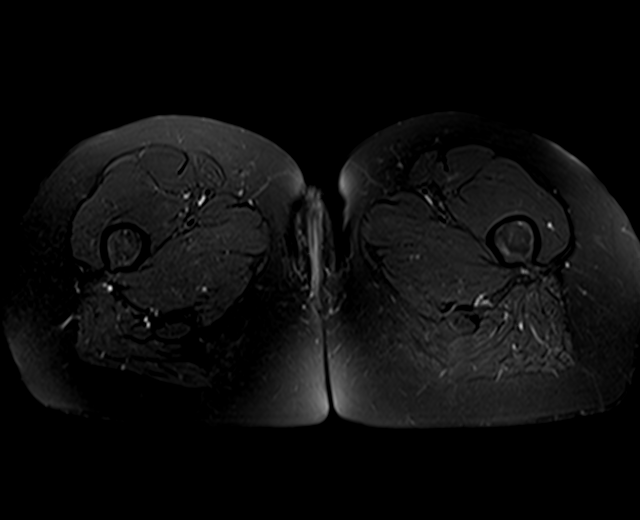
[im 4/29]
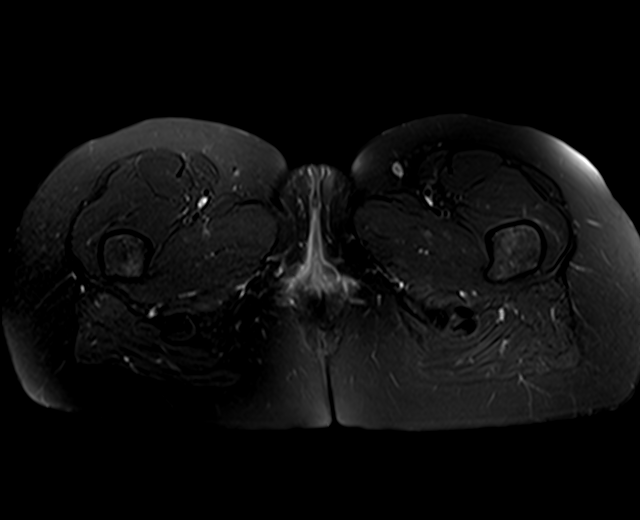
[im 8/29]
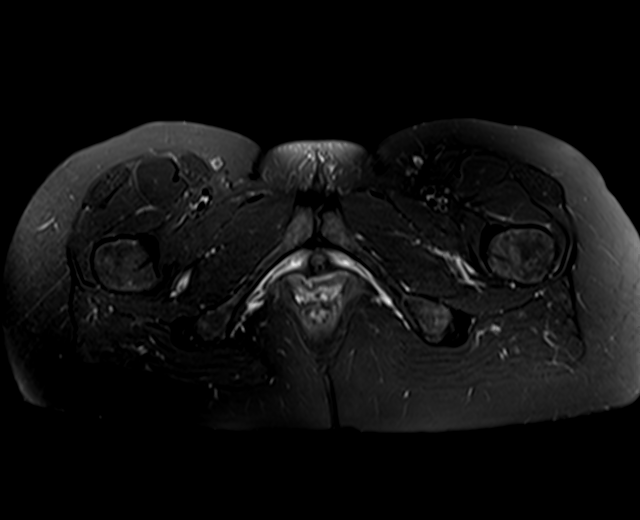
[im 11/29]
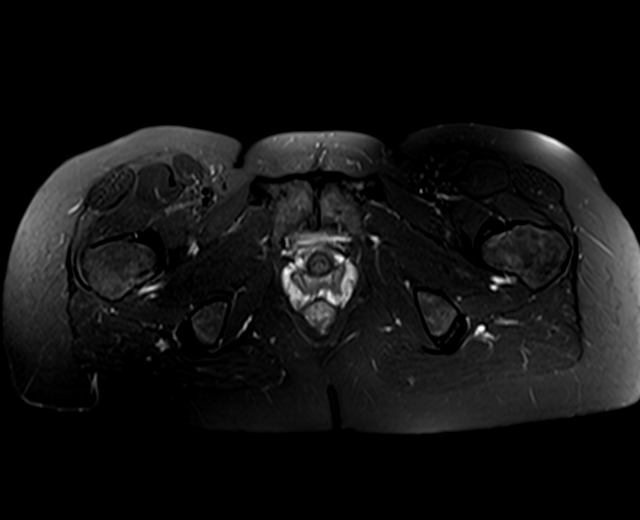
[im 15/29]
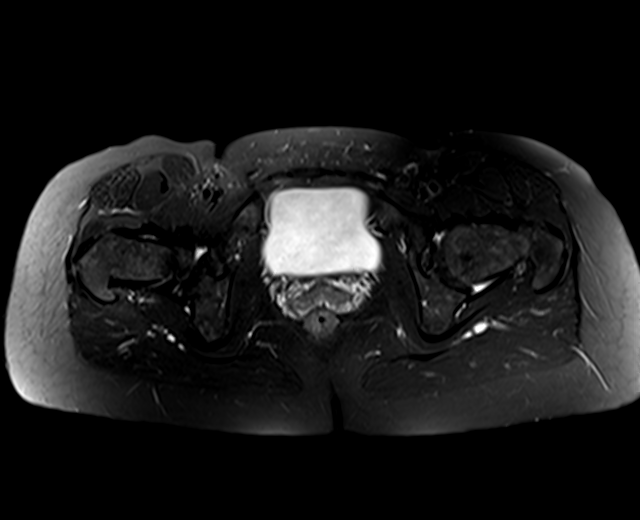
[im 18/29]
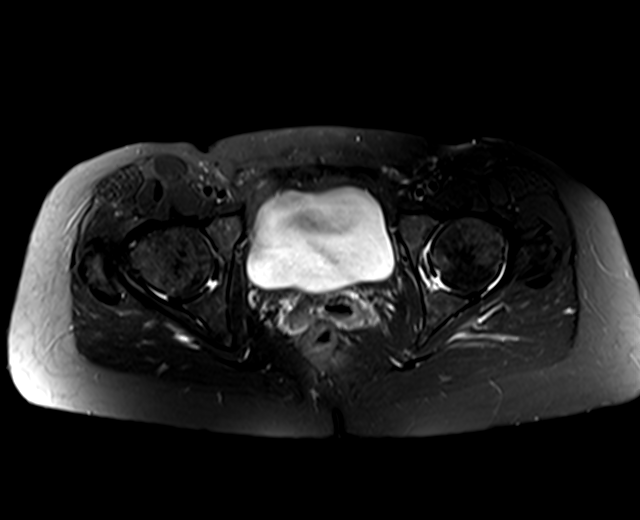
[im 22/29]
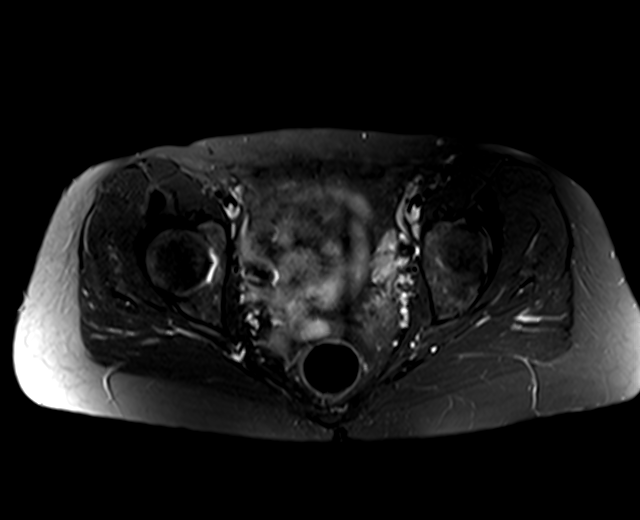
[im 25/29]
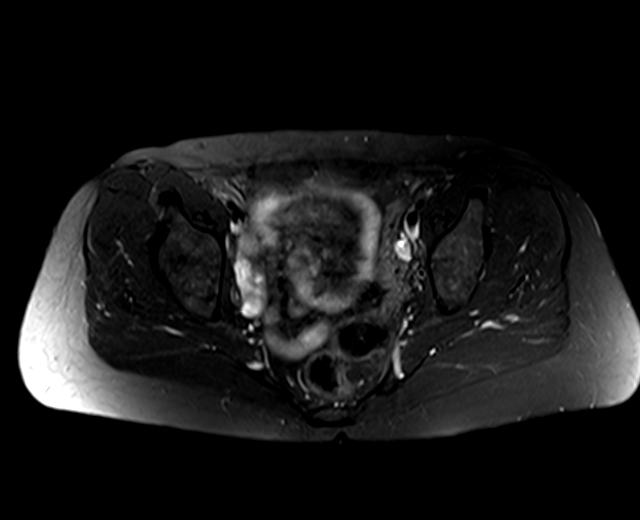
[im 29/29]
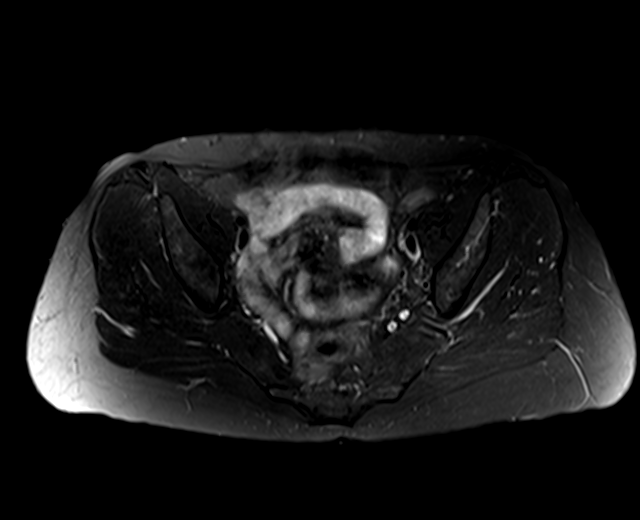

[Series 6: PD fat-sat · sagittal · 4.0mm · 0.70mm/px · 6 of 27 slices shown]
[im 1/27]
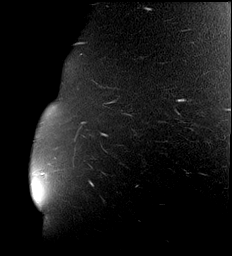
[im 4/27]
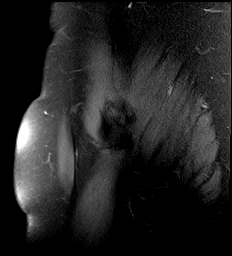
[im 7/27]
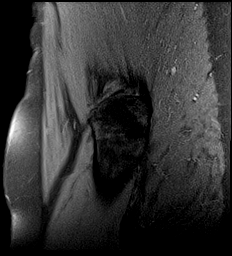
[im 10/27]
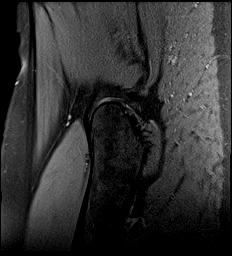
[im 14/27]
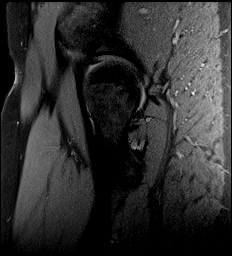
[im 23/27]
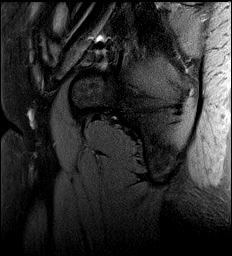

[31 of 40 positions shown; findings below may reference images not displayed]

FINDINGS: Bones: There is no evidence of acute fracture, dislocation or
avascular necrosis. No focal bone lesion. The visualized sacroiliac
joints and symphysis pubis appear normal. There is lower lumbar
spine degenerative disc disease, partially visualized, see prior
lumbar spine MRI in [DATE].

Articular cartilage and labrum

Articular cartilage:  Mild chondrosis.

Labrum: There is anterior superior labral degeneration without
displaced tear (coronal PD images 8-11).

Joint or bursal effusion

Joint effusion: No significant hip joint effusion.

Bursae: No evidence of trochanteric bursitis.

Muscles and tendons

Muscles and tendons: The gluteal tendons are intact. The proximal
hamstrings are intact.The adductors are intact. No muscle atrophy of
edema.

Other findings

Miscellaneous: Prior hysterectomy.
IMPRESSION: Mild left hip osteoarthritis. Anterior superior labral degeneration
without displaced labral tear.

No evidence of trochanteric bursitis. No acute musculotendinous
abnormality.

## 2023-01-20 ENCOUNTER — Ambulatory Visit: Payer: Medicaid Other | Attending: Orthopaedic Surgery | Admitting: Physical Therapy

## 2023-01-20 ENCOUNTER — Encounter: Payer: Self-pay | Admitting: Physical Therapy

## 2023-01-20 ENCOUNTER — Other Ambulatory Visit: Payer: Self-pay

## 2023-01-20 DIAGNOSIS — M25572 Pain in left ankle and joints of left foot: Secondary | ICD-10-CM | POA: Insufficient documentation

## 2023-01-20 DIAGNOSIS — M25561 Pain in right knee: Secondary | ICD-10-CM | POA: Diagnosis present

## 2023-01-20 NOTE — Therapy (Signed)
OUTPATIENT PHYSICAL THERAPY LOWER EXTREMITY EVALUATION   Patient Name: Christina Murillo MRN: [id number] DOB:August 18, 1976, 47 y.o., female Today's Date: 01/20/2023  END OF SESSION:  PT End of Session - 01/20/23 1014     Visit Number 1    Number of Visits 12    Date for PT Re-Evaluation 03/03/23    PT Start Time 0900    PT Stop Time 0931    PT Time Calculation (min) 31 min    Activity Tolerance Patient tolerated treatment well    Behavior During Therapy [facility] for tasks assessed/performed             Past Medical History:  Diagnosis Date   Anxiety    Asthma    Past Surgical History:  Procedure Laterality Date   ABDOMINAL HYSTERECTOMY     Patient Active Problem List   Diagnosis Date Noted   Low back pain 10/07/2020   PTSD (post-traumatic stress disorder) 08/26/2020   Chronic tension-type headache, not intractable 06/27/2019   Adhesive capsulitis of left shoulder 06/06/2017   Cigarette nicotine dependence without complication 05/29/2016   GAD (generalized anxiety disorder) 08/03/2013   REFERRING PROVIDER: Elbert Ewings MD  REFERRING DIAG: Chondromalacia of right knee and sprain of left ankle.   THERAPY DIAG:  Pain in left ankle and joints of left foot  Acute pain of right knee  Rationale for Evaluation and Treatment: Rehabilitation  ONSET DATE: 12/21/22  SUBJECTIVE:   SUBJECTIVE STATEMENT: The patient presents to the clinic with c/o right knee and left ankle pain.  She states that on 12/21/22 her left LE gave way (she has a h/o LBP) and she fell sustaining a right knee and left ankle injury.  Her right knee pain became so bad that she had had to present to an ED on 12/26/22.  She rates her right knee pain-level at a 6/10 but can rise to higher levels with increased weight bearing.  She is currently in a right knee brace with patellar orifice and a left ankle ASO.  She is walking with axillary crutches and is NWBing over her left LE but per MD notes can begin WBAT.    PERTINENT HISTORY: H/o LBP and left SIJ injections,  PAIN:  Are you having pain? Yes: NPRS scale: 6/10 Pain location: Right knee and left ankle. Pain description: Throbbing, sharp and shooting. Aggravating factors: Movement, standing.. Relieving factors: Ice and rest.  PRECAUTIONS: Other: Please supervise gait.  WEIGHT BEARING RESTRICTIONS: No.  Per MD note can be WBAT.  FALLS:  Has patient fallen in last 6 months? Yes. Number of falls as above.  LIVING ENVIRONMENT: Lives in: House/apartment Has following equipment at home: Crutches PLOF: Independent  PATIENT GOALS: Return to walking without use of crutches or walker.   OBJECTIVE:   DIAGNOSTIC FINDINGS: Imaging Results - XR Knee 3 Views Right (12/26/2022 1:31 PM EDT) Narrative  12/26/2022 1:38 PM EDT  INDICATION: Pain  TECHNIQUE: XR KNEE 3 VIEWS RIGHT. Comparison none.  FINDINGS: No fracture, dislocation, or radiopaque foreign body. Mild osteoarthritis. No knee joint effusion     EDEMA:  No significant right knee edema.  Localized edema in area of left lateral malleolus.  PALPATION: Tender to palpation over right quad tendon region and left ATFL and some over the fifth MT.  LOWER EXTREMITY ROM:  Full active right knee and left ankle range of motion.  LOWER EXTREMITY MMT:  Patient able to perform a right SLR without extensor lag and easily performing an antigravity right SAQ.  SPECIAL TESTS:  Normal right knee stability.  GAIT: The patient presented to the clinic with bilateral axillary crutches and NWBing over her left LE with ASO donned and left knee brace with patellar orifice.   PATIENT EDUCATION:  Education details: Left ankle pumps and active left knee flexion and extension.  Discussed beginning with TTWB on right LE with bilateral axillary crutches. Person educated: Patient Education method: Medical illustrator Education comprehension: verbalized understanding and returned  demonstration  HOME EXERCISE PROGRAM: As above.  ASSESSMENT:  CLINICAL IMPRESSION: The patient presents to the clinic with c/o right knee and left ankle pain due to a fall.  Her left ankle is remarkable for localized edema over her lateral malleolar region.  She has full active left ankle range of motion.  Her right knee demonstrates full active range of motion as well and she can perform a left SLR and SAQ against gravity.  Her right knee demonstrates normal stability.  She c/o pain in the area of the right quadriceps tendon.  She has a right knee brace donned with patellar orifice and a left ankle ASO.  She was instructed to begin with TTWB over her left LE with bilaterally axillary crutches with progression to WBAT per MD note.  Patient will benefit from skilled physical therapy intervention to address pain and deficits.  OBJECTIVE IMPAIRMENTS: Abnormal gait, decreased activity tolerance, decreased strength, increased edema, and pain.   ACTIVITY LIMITATIONS: carrying, lifting, bending, standing, and locomotion level  PARTICIPATION LIMITATIONS: meal prep, cleaning, and laundry  PERSONAL FACTORS: 1 comorbidity: h/o LBP.  are also affecting patient's functional outcome.   REHAB POTENTIAL: Excellent  CLINICAL DECISION MAKING: Stable/uncomplicated  EVALUATION COMPLEXITY: Low   GOALS:  SHORT TERM GOALS: Target date: 02/03/23  Ind with an initial HEP. Goal status: INITIAL   LONG TERM GOALS: Target date: 03/03/23.  Ind with an advanced HEP.  Goal status: INITIAL  2.  5/5 left ankle and right knee strength to increase stability for functional tasks.  Goal status: INITIAL  3.  Perform ADL's with pain not > 2-3/10.  Goal status: INITIAL  4.  Patient walk a community distance without assistive device or braces.  Goal status: INITIAL PLAN:  PT FREQUENCY: 2x/week  PT DURATION: 6 weeks  PLANNED INTERVENTIONS: Therapeutic exercises, Therapeutic activity, Neuromuscular  re-education, Gait training, Patient/Family education, Self Care, Electrical stimulation, Cryotherapy, Moist heat, Vasopneumatic device, Ultrasound, and Manual therapy  PLAN FOR NEXT SESSION: Nustep, GT with progression to WBAT, pain-free (no pain increase) right knee therex, seated BAPS, yellow T-band left ankle therex.  Vasopneumatic to left ankle.  Modalities and STW/M as needed.   Dovid Bartko, [place], PT 01/20/2023, 12:01 PM

## 2023-01-25 ENCOUNTER — Ambulatory Visit: Payer: Medicaid Other | Attending: Orthopaedic Surgery | Admitting: *Deleted

## 2023-01-25 DIAGNOSIS — M545 Low back pain, unspecified: Secondary | ICD-10-CM | POA: Insufficient documentation

## 2023-01-25 DIAGNOSIS — M25561 Pain in right knee: Secondary | ICD-10-CM | POA: Diagnosis present

## 2023-01-25 DIAGNOSIS — G8929 Other chronic pain: Secondary | ICD-10-CM | POA: Insufficient documentation

## 2023-01-25 DIAGNOSIS — M5416 Radiculopathy, lumbar region: Secondary | ICD-10-CM | POA: Insufficient documentation

## 2023-01-25 DIAGNOSIS — M25572 Pain in left ankle and joints of left foot: Secondary | ICD-10-CM | POA: Insufficient documentation

## 2023-01-25 NOTE — Therapy (Signed)
OUTPATIENT PHYSICAL THERAPY LOWER EXTREMITY EVALUATION   Patient Name: Christina Murillo MRN: [id number] DOB:[birth year], 47 y.o., female Today's Date: 01/25/2023  END OF SESSION:  PT End of Session - 01/25/23 1356     Visit Number 2    Number of Visits 12    Date for PT Re-Evaluation 03/03/23    PT Start Time 1349    PT Stop Time 1440    PT Time Calculation (min) 51 min             Past Medical History:  Diagnosis Date   Anxiety    Asthma    Past Surgical History:  Procedure Laterality Date   ABDOMINAL HYSTERECTOMY     Patient Active Problem List   Diagnosis Date Noted   Low back pain 10/07/2020   PTSD (post-traumatic stress disorder) 08/26/2020   Chronic tension-type headache, not intractable 06/27/2019   Adhesive capsulitis of left shoulder 06/06/2017   Cigarette nicotine dependence without complication 05/29/2016   GAD (generalized anxiety disorder) 08/03/2013   REFERRING PROVIDER: Elbert Ewings MD  REFERRING DIAG: Chondromalacia of right knee and sprain of left ankle.   THERAPY DIAG:  Pain in left ankle and joints of left foot  Acute pain of right knee  Rationale for Evaluation and Treatment: Rehabilitation  ONSET DATE: 12/21/22  SUBJECTIVE:   SUBJECTIVE STATEMENT: The patient presents to the clinic with c/o right knee and left ankle pain. Pain today LT foot 6/10      PERTINENT HISTORY: H/o LBP and left SIJ injections,  PAIN:  Are you having pain? Yes: NPRS scale: 6/10 Pain location: Right knee and left ankle. Pain description: Throbbing, sharp and shooting. Aggravating factors: Movement, standing.. Relieving factors: Ice and rest.  PRECAUTIONS: Other: Please supervise gait.  WEIGHT BEARING RESTRICTIONS: No.  Per MD note can be WBAT.  FALLS:  Has patient fallen in last 6 months? Yes. Number of falls as above.  LIVING ENVIRONMENT: Lives in: House/apartment Has following equipment at home: Crutches PLOF: Independent  PATIENT GOALS:  Return to walking without use of crutches or walker.   OBJECTIVE:   Treatment     01-25-23                                        EXERCISE LOG     Date  01-25-23       RT knee      LT ankle  Exercise Repetitions and Resistance Comments  Nustep L2 x 12 mins    Seated Dyna Disc DF/PF, CW/CCW  x 5 mins   LAQ's 2#  2x10 pause at top   Seated Rocker board DF, PF   x 3 mins   SLR 2x5   Seated ball squeeze    Seated clamshell         Blank cell = exercise not performed today    Gait TDWB RT foot  with Bil. Crutches  Vaso x 10 mins LT ankle  low pressure   DIAGNOSTIC FINDINGS: Imaging Results - XR Knee 3 Views Right (12/26/2022 1:31 PM EDT) Narrative  12/26/2022 1:38 PM EDT  INDICATION: Pain  TECHNIQUE: XR KNEE 3 VIEWS RIGHT. Comparison none.  FINDINGS: No fracture, dislocation, or radiopaque foreign body. Mild osteoarthritis. No knee joint effusion     EDEMA:  No significant right knee edema.  Localized edema in area of left lateral malleolus.  PALPATION: Tender to palpation over  right quad tendon region and left ATFL and some over the fifth MT.  LOWER EXTREMITY ROM:  Full active right knee and left ankle range of motion.  LOWER EXTREMITY MMT:  Patient able to perform a right SLR without extensor lag and easily performing an antigravity right SAQ.  SPECIAL TESTS:  Normal right knee stability.  GAIT: The patient presented to the clinic with bilateral axillary crutches and NWBing over her left LE with ASO donned and left knee brace with patellar orifice.   PATIENT EDUCATION:  Education details: Left ankle pumps and active left knee flexion and extension.  Discussed beginning with TTWB on right LE with bilateral axillary crutches. Person educated: Patient Education method: Medical illustrator Education comprehension: verbalized understanding and returned demonstration  HOME EXERCISE PROGRAM: As above.  ASSESSMENT:  CLINICAL IMPRESSION: Pt arrived today  doing fair with RT knee and LT ankle. She was able to perform nustep as well as seated LT ankle and RT  knee exs and did well. We discussed performing LAQs and SLR for HEP as well as ABC's for LT ankle. Vaso end of session.         OBJECTIVE IMPAIRMENTS: Abnormal gait, decreased activity tolerance, decreased strength, increased edema, and pain.   ACTIVITY LIMITATIONS: carrying, lifting, bending, standing, and locomotion level  PARTICIPATION LIMITATIONS: meal prep, cleaning, and laundry  PERSONAL FACTORS: 1 comorbidity: h/o LBP.  are also affecting patient's functional outcome.   REHAB POTENTIAL: Excellent  CLINICAL DECISION MAKING: Stable/uncomplicated  EVALUATION COMPLEXITY: Low   GOALS:  SHORT TERM GOALS: Target date: 02/03/23  Ind with an initial HEP. Goal status: INITIAL   LONG TERM GOALS: Target date: 03/03/23.  Ind with an advanced HEP.  Goal status: INITIAL  2.  5/5 left ankle and right knee strength to increase stability for functional tasks.  Goal status: INITIAL  3.  Perform ADL's with pain not > 2-3/10.  Goal status: INITIAL  4.  Patient walk a community distance without assistive device or braces.  Goal status: INITIAL PLAN:  PT FREQUENCY: 2x/week  PT DURATION: 6 weeks  PLANNED INTERVENTIONS: Therapeutic exercises, Therapeutic activity, Neuromuscular re-education, Gait training, Patient/Family education, Self Care, Electrical stimulation, Cryotherapy, Moist heat, Vasopneumatic device, Ultrasound, and Manual therapy  PLAN FOR NEXT SESSION: Nustep, GT with progression to WBAT, pain-free (no pain increase) right knee therex, seated BAPS, yellow T-band left ankle therex.  Vasopneumatic to left ankle.  Modalities and STW/M as needed.   Kimi Kroft,CHRIS, PTA 01/25/2023, 3:11 PM

## 2023-01-27 ENCOUNTER — Ambulatory Visit: Payer: Medicaid Other | Admitting: Physical Therapy

## 2023-01-27 ENCOUNTER — Encounter: Payer: Self-pay | Admitting: Physical Therapy

## 2023-01-27 DIAGNOSIS — M25572 Pain in left ankle and joints of left foot: Secondary | ICD-10-CM

## 2023-01-27 DIAGNOSIS — M25561 Pain in right knee: Secondary | ICD-10-CM

## 2023-01-27 NOTE — Therapy (Addendum)
OUTPATIENT PHYSICAL THERAPY LOWER EXTREMITY TREATMENT   Patient Name: Christina Murillo MRN: [id number] DOB:1975-12-15, 47 y.o., female Today's Date: 01/27/2023  END OF SESSION:  PT End of Session - 01/27/23 1102     Visit Number 3    Number of Visits 12    Date for PT Re-Evaluation 03/03/23    PT Start Time 1101    PT Stop Time 1152    PT Time Calculation (min) 51 min    Equipment Utilized During Treatment Other (comment)   R knee brace, L ASO   Activity Tolerance Patient tolerated treatment well    Behavior During Therapy [facility] for tasks assessed/performed            Past Medical History:  Diagnosis Date   Anxiety    Asthma    Past Surgical History:  Procedure Laterality Date   ABDOMINAL HYSTERECTOMY     Patient Active Problem List   Diagnosis Date Noted   Low back pain 10/07/2020   PTSD (post-traumatic stress disorder) 08/26/2020   Chronic tension-type headache, not intractable 06/27/2019   Adhesive capsulitis of left shoulder 06/06/2017   Cigarette nicotine dependence without complication 05/29/2016   GAD (generalized anxiety disorder) 08/03/2013   REFERRING PROVIDER: Elbert Ewings MD  REFERRING DIAG: Chondromalacia of right knee and sprain of left ankle.   THERAPY DIAG:  Pain in left ankle and joints of left foot  Acute pain of right knee  Rationale for Evaluation and Treatment: Rehabilitation  ONSET DATE: 12/21/22  SUBJECTIVE:   SUBJECTIVE STATEMENT: Reports she has been doing ABCs and wore a shoe on L foot today. Sore along quads, and bony aspect of L ankle.  PERTINENT HISTORY: H/o LBP and left SIJ injections  PAIN:  Are you having pain? Yes: NPRS scale: 5-6/10 Pain location: Right knee and left ankle. Pain description: Throbbing, sharp and shooting. Aggravating factors: Movement, standing.. Relieving factors: Ice and rest.  PRECAUTIONS: Other: Please supervise gait.  WEIGHT BEARING RESTRICTIONS: No.  Per MD note can be WBAT.  FALLS:   Has patient fallen in last 6 months? Yes. Number of falls as above.  LIVING ENVIRONMENT: Lives in: House/apartment Has following equipment at home: Crutches  PLOF: Independent  PATIENT GOALS: Return to walking without use of crutches or walker.  OBJECTIVE:    EXERCISE LOG     Date  01-27-23       RT knee      LT ankle  Exercise Repetitions and Resistance Comments  Nustep L2 x 17 mins  Shoes donned  LAQ's 2#  2x10 pause at top   Seated Rocker board DF, PF   x 3 mins   SLR 2x10   Seated heel raises X20 reps   Toe raises, circles Attempted but pain reported at medial and lateral malleoli        Blank cell = exercise not performed today   Modalities  Date: 01/27/23 Vaso: Ankle, Low, 10 mins, Edema  DIAGNOSTIC FINDINGS: Imaging Results - XR Knee 3 Views Right (12/26/2022 1:31 PM EDT) Narrative  12/26/2022 1:38 PM EDT  INDICATION: Pain  TECHNIQUE: XR KNEE 3 VIEWS RIGHT. Comparison none.  FINDINGS: No fracture, dislocation, or radiopaque foreign body. Mild osteoarthritis. No knee joint effusion     EDEMA:  No significant right knee edema.  Localized edema in area of left lateral malleolus.  PALPATION: Tender to palpation over right quad tendon region and left ATFL and some over the fifth MT.  LOWER EXTREMITY ROM: Full active right knee  and left ankle range of motion.  LOWER EXTREMITY MMT: Patient able to perform a right SLR without extensor lag and easily performing an antigravity right SAQ.  SPECIAL TESTS:  Normal right knee stability.  GAIT: The patient presented to the clinic with bilateral axillary crutches and NWBing over her left LE with ASO donned and left knee brace with patellar orifice.  PATIENT EDUCATION:  Education details: Left ankle pumps and active left knee flexion and extension.  Discussed beginning with TTWB on right LE with bilateral axillary crutches. Person educated: Patient Education method: Medical illustrator Education comprehension:  verbalized understanding and returned demonstration  HOME EXERCISE PROGRAM: As above.  ASSESSMENT:  CLINICAL IMPRESSION:  Patient presented in clinic with mod R knee and L ankle pain. Patient ambulating with greater TTWB today with B axillary crutches. Both braces donned and shoes were donned for Nustep warm up. Patient limited with ankle ROM especially into eversion and along B malleoli. Patient states that she can stand for a short period in shower but has pain along lateral L ankle and lateral foot. Slow and cautious form with therex due to discomfort. Normal vasopneumatic response noted following removal of the modality.  OBJECTIVE IMPAIRMENTS: Abnormal gait, decreased activity tolerance, decreased strength, increased edema, and pain.   ACTIVITY LIMITATIONS: carrying, lifting, bending, standing, and locomotion level  PARTICIPATION LIMITATIONS: meal prep, cleaning, and laundry  PERSONAL FACTORS: 1 comorbidity: h/o LBP.  are also affecting patient's functional outcome.   REHAB POTENTIAL: Excellent  CLINICAL DECISION MAKING: Stable/uncomplicated  EVALUATION COMPLEXITY: Low  GOALS:  SHORT TERM GOALS: Target date: 02/03/23  Ind with an initial HEP. Goal status: MET  LONG TERM GOALS: Target date: 03/03/23.  Ind with an advanced HEP.  Goal status: On-going  2.  5/5 left ankle and right knee strength to increase stability for functional tasks.  Goal status: On-going  3.  Perform ADL's with pain not > 2-3/10.  Goal status: On-going  4.  Patient walk a community distance without assistive device or braces.  Goal status: On-going  PLAN:  PT FREQUENCY: 2x/week  PT DURATION: 6 weeks  PLANNED INTERVENTIONS: Therapeutic exercises, Therapeutic activity, Neuromuscular re-education, Gait training, Patient/Family education, Self Care, Electrical stimulation, Cryotherapy, Moist heat, Vasopneumatic device, Ultrasound, and Manual therapy  PLAN FOR NEXT SESSION: Nustep, GT with  progression to WBAT, pain-free (no pain increase) right knee therex, seated BAPS, yellow T-band left ankle therex.  Vasopneumatic to left ankle.  Modalities and STW/M as needed.  Marvell Fuller, PTA 01/27/2023, 12:03 PM

## 2023-02-01 ENCOUNTER — Ambulatory Visit: Payer: Medicaid Other | Admitting: *Deleted

## 2023-02-01 DIAGNOSIS — M25572 Pain in left ankle and joints of left foot: Secondary | ICD-10-CM

## 2023-02-01 DIAGNOSIS — M25561 Pain in right knee: Secondary | ICD-10-CM

## 2023-02-01 NOTE — Therapy (Signed)
OUTPATIENT PHYSICAL THERAPY LOWER EXTREMITY TREATMENT   Patient Name: Christina Murillo MRN: [id number] DOB:17-Sep-1975, 47 y.o., female Today's Date: 02/01/2023  END OF SESSION:  PT End of Session - 02/01/23 1110     Visit Number 4    Number of Visits 12    Date for PT Re-Evaluation 03/03/23    PT Start Time 1100    PT Stop Time 1151    PT Time Calculation (min) 51 min            Past Medical History:  Diagnosis Date   Anxiety    Asthma    Past Surgical History:  Procedure Laterality Date   ABDOMINAL HYSTERECTOMY     Patient Active Problem List   Diagnosis Date Noted   Low back pain 10/07/2020   PTSD (post-traumatic stress disorder) 08/26/2020   Chronic tension-type headache, not intractable 06/27/2019   Adhesive capsulitis of left shoulder 06/06/2017   Cigarette nicotine dependence without complication 05/29/2016   GAD (generalized anxiety disorder) 08/03/2013   REFERRING PROVIDER: Elbert Ewings MD  REFERRING DIAG: Chondromalacia of right knee and sprain of left ankle.   THERAPY DIAG:  Pain in left ankle and joints of left foot  Acute pain of right knee  Rationale for Evaluation and Treatment: Rehabilitation  ONSET DATE: 12/21/22  SUBJECTIVE:   SUBJECTIVE STATEMENT: Pt reports LT ankle pain 7/10, RT knee 4/10. MD visit next week for LT ankle     PERTINENT HISTORY: H/o LBP and left SIJ injections  PAIN:  Are you having pain? Yes: NPRS scale:  /10 Pain location: Right knee and left ankle. Pain description: Throbbing, sharp and shooting. Aggravating factors: Movement, standing.. Relieving factors: Ice and rest.  PRECAUTIONS: Other: Please supervise gait.  WEIGHT BEARING RESTRICTIONS: No.  Per MD note can be WBAT.  FALLS:  Has patient fallen in last 6 months? Yes. Number of falls as above.  LIVING ENVIRONMENT: Lives in: House/apartment Has following equipment at home: Crutches  PLOF: Independent  PATIENT GOALS: Return to walking  without use of crutches or walker.  OBJECTIVE:    EXERCISE LOG     Date  02-01-23       RT knee      LT ankle  Exercise Repetitions and Resistance Comments  Nustep L2 x 15 mins  Shoes donned  LAQ's 4#  2x10 pause at top   HS curls    Seated Rocker board DF, PF   x 3 mins, EV/INV   Dyna disc     SLR X10 BIl   Hip ABD X10 Bil   Seated heel raises    Toe raises, circles         Blank cell = exercise not performed today   Modalities  Date: 01/27/23 Vaso: Ankle, Low, 10 mins, Edema  DIAGNOSTIC FINDINGS: Imaging Results - XR Knee 3 Views Right (12/26/2022 1:31 PM EDT) Narrative  12/26/2022 1:38 PM EDT  INDICATION: Pain  TECHNIQUE: XR KNEE 3 VIEWS RIGHT. Comparison none.  FINDINGS: No fracture, dislocation, or radiopaque foreign body. Mild osteoarthritis. No knee joint effusion     EDEMA:  No significant right knee edema.  Localized edema in area of left lateral malleolus.  PALPATION: Tender to palpation over right quad tendon region and left ATFL and some over the fifth MT.  LOWER EXTREMITY ROM: Full active right knee and left ankle range of motion.  LOWER EXTREMITY MMT: Patient able to perform a right SLR without extensor lag and easily performing an antigravity right  SAQ.  SPECIAL TESTS:  Normal right knee stability.  GAIT: The patient presented to the clinic with bilateral axillary crutches and NWBing over her left LE with ASO donned and left knee brace with patellar orifice.  PATIENT EDUCATION:  Education details: Left ankle pumps and active left knee flexion and extension.  Discussed beginning with TTWB on right LE with bilateral axillary crutches. Person educated: Patient Education method: Medical illustrator Education comprehension: verbalized understanding and returned demonstration  HOME EXERCISE PROGRAM: As above.  ASSESSMENT:  CLINICAL IMPRESSION: Pt arrived today doing fairly well , but with RT knee and LT ankle pain. Rx focused on  seated LE strengthening exs for LT and RT LE as well as proprioception exs for   LT ankle seated. She did well with progression of 4# LAQ's and side lying hip abduction. Vaso performed to LT ankle for notable swelling. P     OBJECTIVE IMPAIRMENTS: Abnormal gait, decreased activity tolerance, decreased strength, increased edema, and pain.   ACTIVITY LIMITATIONS: carrying, lifting, bending, standing, and locomotion level  PARTICIPATION LIMITATIONS: meal prep, cleaning, and laundry  PERSONAL FACTORS: 1 comorbidity: h/o LBP.  are also affecting patient's functional outcome.   REHAB POTENTIAL: Excellent  CLINICAL DECISION MAKING: Stable/uncomplicated  EVALUATION COMPLEXITY: Low  GOALS:  SHORT TERM GOALS: Target date: 02/03/23  Ind with an initial HEP. Goal status: MET  LONG TERM GOALS: Target date: 03/03/23.  Ind with an advanced HEP.  Goal status: On-going  2.  5/5 left ankle and right knee strength to increase stability for functional tasks.  Goal status: On-going  3.  Perform ADL's with pain not > 2-3/10.  Goal status: On-going  4.  Patient walk a community distance without assistive device or braces.  Goal status: On-going  PLAN:  PT FREQUENCY: 2x/week  PT DURATION: 6 weeks  PLANNED INTERVENTIONS: Therapeutic exercises, Therapeutic activity, Neuromuscular re-education, Gait training, Patient/Family education, Self Care, Electrical stimulation, Cryotherapy, Moist heat, Vasopneumatic device, Ultrasound, and Manual therapy  PLAN FOR NEXT SESSION: Nustep, GT with progression to WBAT, pain-free (no pain increase) right knee therex, seated BAPS, yellow T-band left ankle therex.  Vasopneumatic to left ankle.  Modalities and STW/M as needed.  Chaneka Trefz,CHRIS, PTA 02/01/2023, 1:25 PM

## 2023-02-03 ENCOUNTER — Encounter: Payer: Self-pay | Admitting: Physical Therapy

## 2023-02-03 ENCOUNTER — Ambulatory Visit: Payer: Medicaid Other | Admitting: Physical Therapy

## 2023-02-03 DIAGNOSIS — M25572 Pain in left ankle and joints of left foot: Secondary | ICD-10-CM | POA: Diagnosis not present

## 2023-02-03 DIAGNOSIS — M25561 Pain in right knee: Secondary | ICD-10-CM

## 2023-02-03 NOTE — Therapy (Signed)
OUTPATIENT PHYSICAL THERAPY LOWER EXTREMITY TREATMENT   Patient Name: Christina Murillo MRN: [id number] DOB:[birth year], 47 y.o., female Today's Date: 02/03/2023  END OF SESSION:  PT End of Session - 02/03/23 1105     Visit Number 5    Number of Visits 12    Date for PT Re-Evaluation 03/03/23    PT Start Time 1104    PT Stop Time 1152    PT Time Calculation (min) 48 min    Equipment Utilized During Treatment Other (comment)   R knee brace, B axillary crutch   Activity Tolerance Patient tolerated treatment well    Behavior During Therapy [facility] for tasks assessed/performed            Past Medical History:  Diagnosis Date   Anxiety    Asthma    Past Surgical History:  Procedure Laterality Date   ABDOMINAL HYSTERECTOMY     Patient Active Problem List   Diagnosis Date Noted   Low back pain 10/07/2020   PTSD (post-traumatic stress disorder) 08/26/2020   Chronic tension-type headache, not intractable 06/27/2019   Adhesive capsulitis of left shoulder 06/06/2017   Cigarette nicotine dependence without complication 05/29/2016   GAD (generalized anxiety disorder) 08/03/2013   REFERRING PROVIDER: Elbert Ewings MD  REFERRING DIAG: Chondromalacia of right knee and sprain of left ankle.   THERAPY DIAG:  Pain in left ankle and joints of left foot  Acute pain of right knee  Rationale for Evaluation and Treatment: Rehabilitation  ONSET DATE: 12/21/22  SUBJECTIVE:   SUBJECTIVE STATEMENT:  Reports more swelling along lateral L foot yesterday. Has noticed of her xrays taken before that the xrays did not show her lateral foot. Patient also reported more pain of the entire R knee as well.  PERTINENT HISTORY: H/o LBP and left SIJ injections  PAIN:  Are you having pain? Yes: NPRS scale: 6-7/10 Pain location: Right knee and left ankle. Pain description: Throbbing, sharp and shooting. Aggravating factors: Movement, standing.. Relieving factors: Ice and rest.  PRECAUTIONS:  Other: Please supervise gait.  WEIGHT BEARING RESTRICTIONS: No.  Per MD note can be WBAT.  FALLS:  Has patient fallen in last 6 months? Yes. Number of falls as above.  LIVING ENVIRONMENT: Lives in: House/apartment Has following equipment at home: Crutches  PLOF: Independent  PATIENT GOALS: Return to walking without use of crutches or walker.  OBJECTIVE:    EXERCISE LOG     Date  02-03-23       RT knee      LT ankle  Exercise Repetitions and Resistance Comments  Nustep L3 x 15 mins  Shoes donned  LAQ's 4#  2x10 pause at top   Dyna disc Circles x20 reps    SLR X20 reps RLE   Hip ABD X20 reps RLE   Seated heel raises X20 reps   ABCs X1 rep        Blank cell = exercise not performed today   Modalities  Date: 02/03/23 Vaso: Ankle, Low, 10 mins, Edema  DIAGNOSTIC FINDINGS: Imaging Results - XR Knee 3 Views Right (12/26/2022 1:31 PM EDT) Narrative  12/26/2022 1:38 PM EDT  INDICATION: Pain  TECHNIQUE: XR KNEE 3 VIEWS RIGHT. Comparison none.  FINDINGS: No fracture, dislocation, or radiopaque foreign body. Mild osteoarthritis. No knee joint effusion     EDEMA:  No significant right knee edema.  Localized edema in area of left lateral malleolus.  PALPATION: Tender to palpation over right quad tendon region and left ATFL and some over  the fifth MT.  LOWER EXTREMITY ROM: Full active right knee and left ankle range of motion.  LOWER EXTREMITY MMT: Patient able to perform a right SLR without extensor lag and easily performing an antigravity right SAQ.  SPECIAL TESTS:  Normal right knee stability.  GAIT: The patient presented to the clinic with bilateral axillary crutches and NWBing over her left LE with ASO donned and left knee brace with patellar orifice.  PATIENT EDUCATION:  Education details: Left ankle pumps and active left knee flexion and extension.  Discussed beginning with TTWB on right LE with bilateral axillary crutches. Person educated: Patient Education  method: Medical illustrator Education comprehension: verbalized understanding and returned demonstration  HOME EXERCISE PROGRAM: As above.  ASSESSMENT:  CLINICAL IMPRESSION:  Patient presented in clinic with reports of increased pain of L ankle and R knee. Patient's L lateral ankle observed with increased edema along lateral malleolus as ASO not donned today. Patient progressed through more ankle ROM exercises with intermittent pain with ankle inversion due to pressure on lateral foot. Patient states that she has tenderness along lateral malleolus if SL while sleeping and to palpation. Normal vasopneumatic response noted following removal of the modality.  OBJECTIVE IMPAIRMENTS: Abnormal gait, decreased activity tolerance, decreased strength, increased edema, and pain.   ACTIVITY LIMITATIONS: carrying, lifting, bending, standing, and locomotion level  PARTICIPATION LIMITATIONS: meal prep, cleaning, and laundry  PERSONAL FACTORS: 1 comorbidity: h/o LBP.  are also affecting patient's functional outcome.   REHAB POTENTIAL: Excellent  CLINICAL DECISION MAKING: Stable/uncomplicated  EVALUATION COMPLEXITY: Low  GOALS:  SHORT TERM GOALS: Target date: 02/03/23  Ind with an initial HEP. Goal status: MET  LONG TERM GOALS: Target date: 03/03/23.  Ind with an advanced HEP.  Goal status: On-going  2.  5/5 left ankle and right knee strength to increase stability for functional tasks.  Goal status: On-going  3.  Perform ADL's with pain not > 2-3/10.  Goal status: On-going  4.  Patient walk a community distance without assistive device or braces.  Goal status: On-going  PLAN:  PT FREQUENCY: 2x/week  PT DURATION: 6 weeks  PLANNED INTERVENTIONS: Therapeutic exercises, Therapeutic activity, Neuromuscular re-education, Gait training, Patient/Family education, Self Care, Electrical stimulation, Cryotherapy, Moist heat, Vasopneumatic device, Ultrasound, and Manual  therapy  PLAN FOR NEXT SESSION: Nustep, GT with progression to WBAT, pain-free (no pain increase) right knee therex, seated BAPS, yellow T-band left ankle therex.  Vasopneumatic to left ankle.  Modalities and STW/M as needed.  Marvell Fuller, PTA 02/03/2023, 11:57 AM

## 2023-02-08 ENCOUNTER — Ambulatory Visit: Payer: Medicaid Other | Admitting: *Deleted

## 2023-02-08 ENCOUNTER — Encounter: Payer: Self-pay | Admitting: *Deleted

## 2023-02-08 DIAGNOSIS — M25572 Pain in left ankle and joints of left foot: Secondary | ICD-10-CM

## 2023-02-08 DIAGNOSIS — G8929 Other chronic pain: Secondary | ICD-10-CM

## 2023-02-08 DIAGNOSIS — M5416 Radiculopathy, lumbar region: Secondary | ICD-10-CM

## 2023-02-08 DIAGNOSIS — M25561 Pain in right knee: Secondary | ICD-10-CM

## 2023-02-08 NOTE — Therapy (Signed)
02/08/2023, 4:28 PM  OUTPATIENT PHYSICAL THERAPY LOWER EXTREMITY TREATMENT   Patient Name: Christina Murillo MRN: [id number] DOB:[birth year], 47 y.o., female Today's Date: 02/08/2023  END OF SESSION:  PT End of Session - 02/08/23 1111     Visit Number 6    Number of Visits 12    Date for PT Re-Evaluation 03/03/23    PT Start Time 1104    PT Stop Time 1200    PT Time Calculation (min) 56 min             Past Medical History:  Diagnosis Date   Anxiety    Asthma    Past Surgical History:  Procedure Laterality Date   ABDOMINAL HYSTERECTOMY     Patient Active Problem List   Diagnosis Date Noted   Low back pain 10/07/2020   PTSD (post-traumatic stress disorder) 08/26/2020   Chronic tension-type headache, not intractable 06/27/2019   Adhesive capsulitis of left shoulder 06/06/2017   Cigarette nicotine dependence without complication 05/29/2016   GAD (generalized anxiety disorder) 08/03/2013   REFERRING PROVIDER: Elbert Ewings MD  REFERRING DIAG: Chondromalacia of right knee and sprain of left ankle.   THERAPY DIAG:  Pain in left ankle and joints of left foot  Acute pain of right knee  Chronic right-sided low back pain, unspecified whether sciatica present  Radiculopathy, lumbar region  Rationale for Evaluation and Treatment: Rehabilitation  ONSET DATE: 12/21/22  SUBJECTIVE:   SUBJECTIVE STATEMENT:   To see new Ortho MD tomorrow 2:40 for RT knee and LT ankle. Not much change   PERTINENT HISTORY: H/o LBP and left SIJ injections  PAIN:  Are you having pain? Yes: NPRS scale: 6-7/10 Pain location: Right knee and left ankle. Pain description: Throbbing, sharp and shooting. Aggravating factors: Movement, standing.. Relieving factors: Ice and rest.  PRECAUTIONS: Other: Please supervise gait.  WEIGHT BEARING RESTRICTIONS: No.  Per MD note can be WBAT.  FALLS:  Has patient fallen in last 6 months? Yes. Number of falls as above.  LIVING  ENVIRONMENT: Lives in: House/apartment Has following equipment at home: Crutches  PLOF: Independent  PATIENT GOALS: Return to walking without use of crutches or walker.  OBJECTIVE:    EXERCISE LOG     Date  02-08-23       RT knee      LT ankle  Exercise Repetitions and Resistance Comments  Nustep L3 x 15 mins  Shoes donned  LAQ's 4#  3x10 pause at top  Bil.   Dyna disc Circles 3x10 repsCW/CCW    SLR X20 reps BLE   Hip ABD X20 reps BLE   Seated heel raises    ABCs    Rocker board  3x10 DF/PF    Blank cell = exercise not performed today   Modalities  Date: 02/03/23 Vaso: Ankle, Low, 10 mins, Edema  DIAGNOSTIC FINDINGS: Imaging Results - XR Knee 3 Views Right (12/26/2022 1:31 PM EDT) Narrative  12/26/2022 1:38 PM EDT  INDICATION: Pain  TECHNIQUE: XR KNEE 3 VIEWS RIGHT. Comparison none.  FINDINGS: No fracture, dislocation, or radiopaque foreign body. Mild osteoarthritis. No knee joint effusion     EDEMA:  No significant right knee edema.  Localized edema in area of left lateral malleolus.  PALPATION: Tender to palpation over right quad tendon region and left ATFL and some over the fifth MT.  LOWER EXTREMITY ROM: Full active right knee and left ankle range of motion.  LOWER EXTREMITY MMT: Patient able to perform a right SLR without extensor lag  and easily performing an antigravity right SAQ.  SPECIAL TESTS:  Normal right knee stability.  GAIT: The patient presented to the clinic with bilateral axillary crutches and NWBing over her left LE with ASO donned and left knee brace with patellar orifice.  PATIENT EDUCATION:  Education details: Left ankle pumps and active left knee flexion and extension.  Discussed beginning with TTWB on right LE with bilateral axillary crutches. Person educated: Patient Education method: Medical illustrator Education comprehension: verbalized understanding and returned demonstration  HOME EXERCISE PROGRAM: As  above.  ASSESSMENT:  CLINICAL IMPRESSION:  Patient presented in clinic with reports of increased pain of L ankle and R knee, but will see new Ortho MD tomorrow for both. Rx focused on mainly seated exs for LT ankle due to pain/ swelling and light strengthening for RT and LT LE in sitting and on mat. LT ankle pain/and swelling continue to be problematic for progression. Pt is still wearing LT ankle brace during WTB as well as crutches.   OBJECTIVE IMPAIRMENTS: Abnormal gait, decreased activity tolerance, decreased strength, increased edema, and pain.   ACTIVITY LIMITATIONS: carrying, lifting, bending, standing, and locomotion level  PARTICIPATION LIMITATIONS: meal prep, cleaning, and laundry  PERSONAL FACTORS: 1 comorbidity: h/o LBP.  are also affecting patient's functional outcome.   REHAB POTENTIAL: Excellent  CLINICAL DECISION MAKING: Stable/uncomplicated  EVALUATION COMPLEXITY: Low  GOALS:  SHORT TERM GOALS: Target date: 02/03/23  Ind with an initial HEP. Goal status: MET  LONG TERM GOALS: Target date: 03/03/23.  Ind with an advanced HEP.  Goal status: On-going  2.  5/5 left ankle and right knee strength to increase stability for functional tasks.  Goal status: On-going  3.  Perform ADL's with pain not > 2-3/10.  Goal status: On-going  4.  Patient walk a community distance without assistive device or braces.  Goal status: On-going  PLAN:  PT FREQUENCY: 2x/week  PT DURATION: 6 weeks  PLANNED INTERVENTIONS: Therapeutic exercises, Therapeutic activity, Neuromuscular re-education, Gait training, Patient/Family education, Self Care, Electrical stimulation, Cryotherapy, Moist heat, Vasopneumatic device, Ultrasound, and Manual therapy  PLAN FOR NEXT SESSION: Nustep, GT with progression to WBAT, pain-free (no pain increase) right knee therex, seated BAPS, yellow T-band left ankle therex.  Vasopneumatic to left ankle.  Modalities and STW/M as needed.  Robbie Rideaux,CHRIS,  PTA 02/08/2023, 4:28 PM

## 2023-02-10 ENCOUNTER — Encounter: Payer: Medicaid Other | Admitting: Physical Therapy

## 2023-04-08 ENCOUNTER — Encounter: Payer: Self-pay | Admitting: Physical Therapy

## 2023-04-08 ENCOUNTER — Other Ambulatory Visit: Payer: Self-pay

## 2023-04-08 ENCOUNTER — Ambulatory Visit: Payer: Medicaid Other | Attending: Orthopaedic Surgery | Admitting: Physical Therapy

## 2023-04-08 DIAGNOSIS — M25672 Stiffness of left ankle, not elsewhere classified: Secondary | ICD-10-CM | POA: Diagnosis present

## 2023-04-08 DIAGNOSIS — R600 Localized edema: Secondary | ICD-10-CM | POA: Insufficient documentation

## 2023-04-08 DIAGNOSIS — M25572 Pain in left ankle and joints of left foot: Secondary | ICD-10-CM | POA: Diagnosis present

## 2023-04-08 NOTE — Therapy (Signed)
OUTPATIENT PHYSICAL THERAPY LOWER EXTREMITY EVALUATION   Patient Name: Christina Murillo MRN: [id number] DOB:1975-10-18, 47 y.o., female Today's Date: 04/08/2023  END OF SESSION:  PT End of Session - 04/08/23 1501     Visit Number 1    Number of Visits 8    Date for PT Re-Evaluation 05/06/23    PT Start Time 0230    PT Stop Time 0254    PT Time Calculation (min) 24 min    Activity Tolerance Patient tolerated treatment well    Behavior During Therapy [facility] for tasks assessed/performed             Past Medical History:  Diagnosis Date   Anxiety    Asthma    Past Surgical History:  Procedure Laterality Date   ABDOMINAL HYSTERECTOMY     Patient Active Problem List   Diagnosis Date Noted   Low back pain 10/07/2020   PTSD (post-traumatic stress disorder) 08/26/2020   Chronic tension-type headache, not intractable 06/27/2019   Adhesive capsulitis of left shoulder 06/06/2017   Cigarette nicotine dependence without complication 05/29/2016   GAD (generalized anxiety disorder) 08/03/2013    REFERRING PROVIDER: Netta Cedars MD  REFERRING DIAG: Pain of left ankle joint.  THERAPY DIAG:  Pain in left ankle and joints of left foot  Stiffness of left ankle, not elsewhere classified  Localized edema  Rationale for Evaluation and Treatment: Rehabilitation  ONSET DATE: 12/21/22.  SUBJECTIVE:   SUBJECTIVE STATEMENT: The patient returns to the clinic today with continued c/o left ankle pain as the result of a fall on 12/21/22. She reports her pain rises to an 8-9/10 the longer she is up.  If she rolls over in bed onto her left side the pain is excruciating.  She is using bilateral axillary crutches and is wearing an ASO with attempts to wbat.  She states that surgical intervention has been discussed.  She said the doctor would like to see her off the crutches.  We discussed weaning to one crutch in subsequent PT treatments.    PERTINENT HISTORY: H/o chronic LBP and left SIJ  injections.   PAIN:  Are you having pain? Yes: NPRS scale: Rising to an 8-9/10 with increased up time./10 Pain location: Lateral left ankle and lower leg.   Pain description: Ache, throb Aggravating factors: Rolling over in bed, standing and walking.   Relieving factors: Ice and rest.    PRECAUTIONS: Other: Please supervise gait.    WEIGHT BEARING RESTRICTIONS: Per MD would like to see patient wean from crutches.    FALLS:  Has patient fallen in last 6 months? Yes. Number of falls 12/21/22.  LIVING ENVIRONMENT: Lives in: House/apartment Has following equipment at home: Crutches  OCCUPATION: Currently out of work due to injury.  PLOF: Independent  PATIENT GOALS: Get ankle fixed and get back to work.   OBJECTIVE:   EDEMA:  Circumferential: 4 cms left > right.  Her lateral malleolus and distal Achilles is visibly swollen.  PALPATION: Tender along left distal lateral lower leg and lateral ligamentous region.  LOWER EXTREMITY ROM:  Active left ankle dorsiflexion limited to -8 degrees from neutral with knee in full extension and 2 degrees with knee flexed.  Plantarflexion is 40 degrees. Active eversion is -8 degrees.  Patient states she is doing the ankle ABC's at home.  LOWER EXTREMITY MMT:  Left ankle strength testing deferred due to high pain-level.   GAIT: Patient with bilateral axillary crutches and left ASO with attempts to wbat.  PATIENT EDUCATION:    HOME EXERCISE PROGRAM:   ASSESSMENT:  CLINICAL IMPRESSION: The patient presents to OPPT with continued c/o left ankle pain and stiffness due to a fall on 12/21/22.  She is ambulating with attempts to wbat with bilateral axillary crutches.  She has a significant amount of edema in the region of her left lateral malleolus and distal Achilles tendon region.  She has a loss of range of motion and is tender to palpation over the distal lateral lower leg and lateral ligamentous region. Patient may benefit from  skilled physical therapy intervention to address pain and deficits.   OBJECTIVE IMPAIRMENTS: Abnormal gait, decreased activity tolerance, difficulty walking, decreased ROM, increased edema, and pain.   ACTIVITY LIMITATIONS: carrying, lifting, bending, sleeping, bed mobility, and locomotion level  PARTICIPATION LIMITATIONS: meal prep, cleaning, laundry, occupation, and yard work  PERSONAL FACTORS: Time since onset of injury/illness/exacerbation and 1 comorbidity: h/o low back pain  are also affecting patient's functional outcome.   REHAB POTENTIAL: Good-  CLINICAL DECISION MAKING: Stable/uncomplicated  EVALUATION COMPLEXITY: Low   GOALS:  SHORT TERM GOALS: Target date: 05/06/23 Ind with a HEP. Goal status: INITIAL  2.  Increase left ankle dorsiflexion to 6- 8 degrees to normalize the patient's gait pattern.  Goal status: INITIAL  3.  Progress to one axillary crutch wbat over her left LE with ASO.  Goal status: INITIAL  4.  Perform ADL's with left ankle pain not > 3-4/10.  Goal status: INITIAL  PLAN:  PT FREQUENCY: 2x/week  PT DURATION: 4 weeks  PLANNED INTERVENTIONS: Therapeutic exercises, Therapeutic activity, Neuromuscular re-education, Gait training, Patient/Family education, Self Care, Electrical stimulation, Vasopneumatic device, and Manual therapy  PLAN FOR NEXT SESSION: Begin with seated Rockerboard, sheet stretch to increase dorsiflexion, progress to one axillary crutch, BAPS, e'stim, vasopneumatic.     Hillari Zumwalt, [place], PT 04/08/2023, 3:55 PM

## 2023-04-09 ENCOUNTER — Encounter: Payer: Self-pay | Admitting: Physical Therapy

## 2023-04-09 ENCOUNTER — Ambulatory Visit: Payer: Medicaid Other | Admitting: Physical Therapy

## 2023-04-09 DIAGNOSIS — M25572 Pain in left ankle and joints of left foot: Secondary | ICD-10-CM

## 2023-04-09 DIAGNOSIS — R600 Localized edema: Secondary | ICD-10-CM

## 2023-04-09 DIAGNOSIS — M25672 Stiffness of left ankle, not elsewhere classified: Secondary | ICD-10-CM

## 2023-04-09 NOTE — Therapy (Addendum)
OUTPATIENT PHYSICAL THERAPY LOWER EXTREMITY TREATMENT   Patient Name: Christina Murillo MRN: [id number] DOB:Nov 27, 1975, 47 y.o., female Today's Date: 04/09/2023  END OF SESSION:  PT End of Session - 04/09/23 1019     Visit Number 2    Number of Visits 8    Date for PT Re-Evaluation 05/06/23    PT Start Time 0936    PT Stop Time 1028    PT Time Calculation (min) 52 min    Equipment Utilized During Treatment Other (comment)   lace up brace, unilateral crutch   Activity Tolerance Patient tolerated treatment well    Behavior During Therapy [facility] for tasks assessed/performed             Past Medical History:  Diagnosis Date   Anxiety    Asthma    Past Surgical History:  Procedure Laterality Date   ABDOMINAL HYSTERECTOMY     Patient Active Problem List   Diagnosis Date Noted   Low back pain 10/07/2020   PTSD (post-traumatic stress disorder) 08/26/2020   Chronic tension-type headache, not intractable 06/27/2019   Adhesive capsulitis of left shoulder 06/06/2017   Cigarette nicotine dependence without complication 05/29/2016   GAD (generalized anxiety disorder) 08/03/2013    REFERRING PROVIDER: Netta Cedars MD  REFERRING DIAG: Pain of left ankle joint.  THERAPY DIAG:  Pain in left ankle and joints of left foot  Stiffness of left ankle, not elsewhere classified  Localized edema  Rationale for Evaluation and Treatment: Rehabilitation  ONSET DATE: 12/21/22.  SUBJECTIVE:   SUBJECTIVE STATEMENT: Reports torn ligaments in L ankle.  PERTINENT HISTORY: H/o chronic LBP and left SIJ injections.    PAIN:  Are you having pain? Yes: NPRS scale: no numerical value provided/10 Pain location: Lateral left ankle and lower leg.   Pain description: Ache, throb Aggravating factors: Rolling over in bed, standing and walking.   Relieving factors: Ice and rest.    PRECAUTIONS: Other: Please supervise gait.    WEIGHT BEARING RESTRICTIONS: Per MD would like to see patient  wean from crutches.    FALLS:  Has patient fallen in last 6 months? Yes. Number of falls 12/21/22.  LIVING ENVIRONMENT: Lives in: House/apartment Has following equipment at home: Crutches  OCCUPATION: Currently out of work due to injury.  PLOF: Independent  PATIENT GOALS: Get ankle fixed and get back to work.  OBJECTIVE:   EDEMA:  Circumferential: 4 cms left > right.  Her lateral malleolus and distal Achilles is visibly swollen.  PALPATION: Tender along left distal lateral lower leg and lateral ligamentous region.  LOWER EXTREMITY ROM:  Active left ankle dorsiflexion limited to -8 degrees from neutral with knee in full extension and 2 degrees with knee flexed.  Plantarflexion is 40 degrees. Active eversion is -8 degrees.  Patient states she is doing the ankle ABC's at home.  LOWER EXTREMITY MMT: Left ankle strength testing deferred due to high pain-level.  GAIT: Patient with bilateral axillary crutches and left ASO with attempts to wbat.                                   04/09/23   EXERCISE LOG  Exercise Repetitions and Resistance Comments  Nustep L3 x15 min   Seated rockerboard X5 min   Seated dynadisc circles X5 min   Toe raise with ball squeeze X20 reps   Heel raise with ball squeeze X20 reps   Resisted eversion Yellow theraband  x20 reps    Blank cell = exercise not performed today   Modalities  Date: 04/09/23 Vaso: Ankle, Low, 10 mins, Pain and Edema  PATIENT EDUCATION:   HOME EXERCISE PROGRAM:  ASSESSMENT:  CLINICAL IMPRESSION: Patient presented in PT clinic with ASO brace and unilateral crutch. Patient able to tolerate all seated exercises and had no complaints of increased pain. Patient did require demo for some exercise techniques. Normal vasopneumatic response noted following removal of the modality.  OBJECTIVE IMPAIRMENTS: Abnormal gait, decreased activity tolerance, difficulty walking, decreased ROM, increased edema, and pain.   ACTIVITY  LIMITATIONS: carrying, lifting, bending, sleeping, bed mobility, and locomotion level  PARTICIPATION LIMITATIONS: meal prep, cleaning, laundry, occupation, and yard work  PERSONAL FACTORS: Time since onset of injury/illness/exacerbation and 1 comorbidity: h/o low back pain  are also affecting patient's functional outcome.   REHAB POTENTIAL: Good-  CLINICAL DECISION MAKING: Stable/uncomplicated  EVALUATION COMPLEXITY: Low  GOALS:  SHORT TERM GOALS: Target date: 05/06/23 Ind with a HEP. Goal status: INITIAL  2.  Increase left ankle dorsiflexion to 6- 8 degrees to normalize the patient's gait pattern.  Goal status: INITIAL  3.  Progress to one axillary crutch wbat over her left LE with ASO.  Goal status: INITIAL  4.  Perform ADL's with left ankle pain not > 3-4/10.  Goal status: INITIAL  PLAN:  PT FREQUENCY: 2x/week  PT DURATION: 4 weeks  PLANNED INTERVENTIONS: Therapeutic exercises, Therapeutic activity, Neuromuscular re-education, Gait training, Patient/Family education, Self Care, Electrical stimulation, Vasopneumatic device, and Manual therapy  PLAN FOR NEXT SESSION: Begin with seated Rockerboard, sheet stretch to increase dorsiflexion, progress to one axillary crutch, BAPS, e'stim, vasopneumatic.     Marvell Fuller, PTA 04/09/2023, 10:35 AM

## 2023-04-12 ENCOUNTER — Ambulatory Visit: Payer: Medicaid Other

## 2023-04-12 DIAGNOSIS — M25672 Stiffness of left ankle, not elsewhere classified: Secondary | ICD-10-CM

## 2023-04-12 DIAGNOSIS — R600 Localized edema: Secondary | ICD-10-CM

## 2023-04-12 DIAGNOSIS — M25572 Pain in left ankle and joints of left foot: Secondary | ICD-10-CM | POA: Diagnosis not present

## 2023-04-12 NOTE — Therapy (Signed)
OUTPATIENT PHYSICAL THERAPY LOWER EXTREMITY TREATMENT   Patient Name: Christina Murillo MRN: [id number] DOB:[birth year], 47 y.o., female Today's Date: 04/12/2023  END OF SESSION:  PT End of Session - 04/12/23 0804     Visit Number 3    Number of Visits 8    Date for PT Re-Evaluation 05/06/23    PT Start Time 0800    PT Stop Time 0852    PT Time Calculation (min) 52 min    Equipment Utilized During Treatment Other (comment)   lace up brace, unilateral crutch   Activity Tolerance Patient tolerated treatment well    Behavior During Therapy [facility] for tasks assessed/performed             Past Medical History:  Diagnosis Date   Anxiety    Asthma    Past Surgical History:  Procedure Laterality Date   ABDOMINAL HYSTERECTOMY     Patient Active Problem List   Diagnosis Date Noted   Low back pain 10/07/2020   PTSD (post-traumatic stress disorder) 08/26/2020   Chronic tension-type headache, not intractable 06/27/2019   Adhesive capsulitis of left shoulder 06/06/2017   Cigarette nicotine dependence without complication 05/29/2016   GAD (generalized anxiety disorder) 08/03/2013    REFERRING PROVIDER: Netta Cedars MD  REFERRING DIAG: Pain of left ankle joint.  THERAPY DIAG:  Pain in left ankle and joints of left foot  Stiffness of left ankle, not elsewhere classified  Localized edema  Rationale for Evaluation and Treatment: Rehabilitation  ONSET DATE: 12/21/22.  SUBJECTIVE:   SUBJECTIVE STATEMENT: Pt reports increased pain today, 8-9/10 in left ankle up into left hip.  PERTINENT HISTORY: H/o chronic LBP and left SIJ injections.    PAIN:  Are you having pain? Yes: NPRS scale: 8-9/10 Pain location: Lateral left ankle and lower leg.   Pain description: Ache, throb Aggravating factors: Rolling over in bed, standing and walking.   Relieving factors: Ice and rest.    PRECAUTIONS: Other: Please supervise gait.    WEIGHT BEARING RESTRICTIONS: Per MD would like to  see patient wean from crutches.    FALLS:  Has patient fallen in last 6 months? Yes. Number of falls 12/21/22.  LIVING ENVIRONMENT: Lives in: House/apartment Has following equipment at home: Crutches  OCCUPATION: Currently out of work due to injury.  PLOF: Independent  PATIENT GOALS: Get ankle fixed and get back to work.  OBJECTIVE:   EDEMA:  Circumferential: 4 cms left > right.  Her lateral malleolus and distal Achilles is visibly swollen.  PALPATION: Tender along left distal lateral lower leg and lateral ligamentous region.  LOWER EXTREMITY ROM:  Active left ankle dorsiflexion limited to -8 degrees from neutral with knee in full extension and 2 degrees with knee flexed.  Plantarflexion is 40 degrees. Active eversion is -8 degrees.  Patient states she is doing the ankle ABC's at home.  LOWER EXTREMITY MMT: Left ankle strength testing deferred due to high pain-level.  GAIT: Patient with bilateral axillary crutches and left ASO with attempts to wbat.                                   04/12/23   EXERCISE LOG  Exercise Repetitions and Resistance Comments  Nustep L3 x15 min   Seated rockerboard X5 min   Seated dynadisc circles X5 min   ABCs    Toe raise with ball squeeze    Heel raise with ball squeeze  Resisted eversion     Blank cell = exercise not performed today   Modalities  Date: 04/12/23 Unattended Estim: Ankle, IFC 80-150 Hz, 15 mins, Pain Vaso: Ankle, 34 degrees; low pressure, 15 mins, Pain and Edema  PATIENT EDUCATION:   HOME EXERCISE PROGRAM:  ASSESSMENT:  CLINICAL IMPRESSION: Pt arrives for today's treatment session reporting 8-9/10 left ankle and left LE from foot up to hip.  Pt reports that pain has progressively gotten worse since Friday.  Decreased number of exercises today due to increased pain level.  Normal responses to estim and vaso noted upon removal.  Pt reported 5-6/10 left ankle pain at completion of today's treatment session.  OBJECTIVE  IMPAIRMENTS: Abnormal gait, decreased activity tolerance, difficulty walking, decreased ROM, increased edema, and pain.   ACTIVITY LIMITATIONS: carrying, lifting, bending, sleeping, bed mobility, and locomotion level  PARTICIPATION LIMITATIONS: meal prep, cleaning, laundry, occupation, and yard work  PERSONAL FACTORS: Time since onset of injury/illness/exacerbation and 1 comorbidity: h/o low back pain  are also affecting patient's functional outcome.   REHAB POTENTIAL: Good-  CLINICAL DECISION MAKING: Stable/uncomplicated  EVALUATION COMPLEXITY: Low  GOALS:  SHORT TERM GOALS: Target date: 05/06/23 Ind with a HEP. Goal status: INITIAL  2.  Increase left ankle dorsiflexion to 6- 8 degrees to normalize the patient's gait pattern.  Goal status: INITIAL  3.  Progress to one axillary crutch wbat over her left LE with ASO.  Goal status: INITIAL  4.  Perform ADL's with left ankle pain not > 3-4/10.  Goal status: INITIAL  PLAN:  PT FREQUENCY: 2x/week  PT DURATION: 4 weeks  PLANNED INTERVENTIONS: Therapeutic exercises, Therapeutic activity, Neuromuscular re-education, Gait training, Patient/Family education, Self Care, Electrical stimulation, Vasopneumatic device, and Manual therapy  PLAN FOR NEXT SESSION: Begin with seated Rockerboard, sheet stretch to increase dorsiflexion, progress to one axillary crutch, BAPS, e'stim, vasopneumatic.     Newman Pies, PTA 04/12/2023, 8:58 AM

## 2023-04-13 ENCOUNTER — Ambulatory Visit: Payer: Medicaid Other | Admitting: *Deleted

## 2023-04-13 DIAGNOSIS — M25672 Stiffness of left ankle, not elsewhere classified: Secondary | ICD-10-CM

## 2023-04-13 DIAGNOSIS — R600 Localized edema: Secondary | ICD-10-CM

## 2023-04-13 DIAGNOSIS — M25572 Pain in left ankle and joints of left foot: Secondary | ICD-10-CM

## 2023-04-13 NOTE — Therapy (Signed)
OUTPATIENT PHYSICAL THERAPY LOWER EXTREMITY TREATMENT   Patient Name: Christina Murillo MRN: [id number] DOB:1976/06/25, 47 y.o., female Today's Date: 04/13/2023  END OF SESSION:  PT End of Session - 04/13/23 0808     Visit Number 4    Number of Visits 8    Date for PT Re-Evaluation 05/06/23    PT Start Time 0800    PT Stop Time 0900    PT Time Calculation (min) 60 min             Past Medical History:  Diagnosis Date   Anxiety    Asthma    Past Surgical History:  Procedure Laterality Date   ABDOMINAL HYSTERECTOMY     Patient Active Problem List   Diagnosis Date Noted   Low back pain 10/07/2020   PTSD (post-traumatic stress disorder) 08/26/2020   Chronic tension-type headache, not intractable 06/27/2019   Adhesive capsulitis of left shoulder 06/06/2017   Cigarette nicotine dependence without complication 05/29/2016   GAD (generalized anxiety disorder) 08/03/2013    REFERRING PROVIDER: Netta Cedars MD  REFERRING DIAG: Pain of left ankle joint.  THERAPY DIAG:  Pain in left ankle and joints of left foot  Stiffness of left ankle, not elsewhere classified  Localized edema  Rationale for Evaluation and Treatment: Rehabilitation  ONSET DATE: 12/21/22.  SUBJECTIVE:   SUBJECTIVE STATEMENT: Pt reports increased pain today, 8-9/10 in left ankle up into left hip and LB. To MD 05-03-23. Need surgery  PERTINENT HISTORY: H/o chronic LBP and left SIJ injections.    PAIN:  Are you having pain? Yes: NPRS scale: 8-9/10 Pain location: Lateral left ankle and lower leg.   Pain description: Ache, throb Aggravating factors: Rolling over in bed, standing and walking.   Relieving factors: Ice and rest.    PRECAUTIONS: Other: Please supervise gait.    WEIGHT BEARING RESTRICTIONS: Per MD would like to see patient wean from crutches.    FALLS:  Has patient fallen in last 6 months? Yes. Number of falls 12/21/22.  LIVING ENVIRONMENT: Lives in: House/apartment Has  following equipment at home: Crutches  OCCUPATION: Currently out of work due to injury.  PLOF: Independent  PATIENT GOALS: Get ankle fixed and get back to work.  OBJECTIVE:   EDEMA:  Circumferential: 4 cms left > right.  Her lateral malleolus and distal Achilles is visibly swollen.  PALPATION: Tender along left distal lateral lower leg and lateral ligamentous region.  LOWER EXTREMITY ROM:  Active left ankle dorsiflexion limited to -8 degrees from neutral with knee in full extension and 2 degrees with knee flexed.  Plantarflexion is 40 degrees. Active eversion is -8 degrees.  Patient states she is doing the ankle ABC's at home.  LOWER EXTREMITY MMT: Left ankle strength testing deferred due to high pain-level.  GAIT: Patient with bilateral axillary crutches and left ASO with attempts to wbat.                                   04/13/23   EXERCISE LOG    LT ankle  Exercise Repetitions and Resistance Comments  Nustep L3 x15 min   Seated rockerboard X5 min   Seated dynadisc circles X5 min   LT HIP IR and ER side lying on RT 2x20 each   Hip ABD 2xfatigue   Bridging x10   Hooklying Hip ADD  x 5 hold 5 secs   ABCs    Toe raise with  ball squeeze    Heel raise with ball squeeze    Resisted eversion     Blank cell = exercise not performed today   Modalities  Date: 04/12/23 Unattended Estim: Ankle, IFC 80-150 Hz, 10 mins, Pain Vaso: Ankle, 34 degrees; low pressure, 10 mins, Pain and Edema  PATIENT EDUCATION:   HOME EXERCISE PROGRAM:  ASSESSMENT:  CLINICAL IMPRESSION: Pt arrives for today's treatment session reporting 8-9/10 left ankle and left LE from foot up to hip and LB. RX focused on ankle AROM and proprioception exs as well as LT hip strengthening on mat table. Vaso and IFC end of session. Pt did better today with decreased pain end of session.     OBJECTIVE IMPAIRMENTS: Abnormal gait, decreased activity tolerance, difficulty walking, decreased ROM, increased edema,  and pain.   ACTIVITY LIMITATIONS: carrying, lifting, bending, sleeping, bed mobility, and locomotion level  PARTICIPATION LIMITATIONS: meal prep, cleaning, laundry, occupation, and yard work  PERSONAL FACTORS: Time since onset of injury/illness/exacerbation and 1 comorbidity: h/o low back pain  are also affecting patient's functional outcome.   REHAB POTENTIAL: Good-  CLINICAL DECISION MAKING: Stable/uncomplicated  EVALUATION COMPLEXITY: Low  GOALS:  SHORT TERM GOALS: Target date: 05/06/23 Ind with a HEP. Goal status: INITIAL  2.  Increase left ankle dorsiflexion to 6- 8 degrees to normalize the patient's gait pattern.  Goal status: INITIAL  3.  Progress to one axillary crutch wbat over her left LE with ASO.  Goal status: INITIAL  4.  Perform ADL's with left ankle pain not > 3-4/10.  Goal status: INITIAL  PLAN:  PT FREQUENCY: 2x/week  PT DURATION: 4 weeks  PLANNED INTERVENTIONS: Therapeutic exercises, Therapeutic activity, Neuromuscular re-education, Gait training, Patient/Family education, Self Care, Electrical stimulation, Vasopneumatic device, and Manual therapy  PLAN FOR NEXT SESSION: Begin with seated Rockerboard, sheet stretch to increase dorsiflexion, progress to one axillary crutch, BAPS, e'stim, vasopneumatic.     Buel Molder,CHRIS, PTA 04/13/2023, 9:09 AM

## 2023-04-15 ENCOUNTER — Ambulatory Visit: Payer: Medicaid Other | Admitting: *Deleted

## 2023-04-15 DIAGNOSIS — M25572 Pain in left ankle and joints of left foot: Secondary | ICD-10-CM | POA: Diagnosis not present

## 2023-04-15 DIAGNOSIS — M25672 Stiffness of left ankle, not elsewhere classified: Secondary | ICD-10-CM

## 2023-04-15 DIAGNOSIS — R600 Localized edema: Secondary | ICD-10-CM

## 2023-04-15 NOTE — Therapy (Signed)
OUTPATIENT PHYSICAL THERAPY LOWER EXTREMITY TREATMENT   Patient Name: Christina Murillo MRN: [id number] DOB:10-10-1975, 47 y.o., female Today's Date: 04/15/2023  END OF SESSION:  PT End of Session - 04/15/23 0811     Visit Number 5    Number of Visits 8    Date for PT Re-Evaluation 05/06/23    PT Start Time 0802    PT Stop Time 0856    PT Time Calculation (min) 54 min             Past Medical History:  Diagnosis Date   Anxiety    Asthma    Past Surgical History:  Procedure Laterality Date   ABDOMINAL HYSTERECTOMY     Patient Active Problem List   Diagnosis Date Noted   Low back pain 10/07/2020   PTSD (post-traumatic stress disorder) 08/26/2020   Chronic tension-type headache, not intractable 06/27/2019   Adhesive capsulitis of left shoulder 06/06/2017   Cigarette nicotine dependence without complication 05/29/2016   GAD (generalized anxiety disorder) 08/03/2013    REFERRING PROVIDER: Netta Cedars MD  REFERRING DIAG: Pain of left ankle joint.  THERAPY DIAG:  Pain in left ankle and joints of left foot  Stiffness of left ankle, not elsewhere classified  Localized edema  Rationale for Evaluation and Treatment: Rehabilitation  ONSET DATE: 12/21/22.  SUBJECTIVE:   SUBJECTIVE STATEMENT: Pt reports increased pain today, 8-9/10 in left ankle up into left hip and LB. To MD 05-03-23. I'm gonna call the MD office today to see if they can get me in sooner due to the pain not getting better.  PERTINENT HISTORY: H/o chronic LBP and left SIJ injections.    PAIN:  Are you having pain? Yes: NPRS scale: 8-9/10 Pain location: Lateral left ankle and lower leg.   Pain description: Ache, throb Aggravating factors: Rolling over in bed, standing and walking.   Relieving factors: Ice and rest.    PRECAUTIONS: Other: Please supervise gait.    WEIGHT BEARING RESTRICTIONS: Per MD would like to see patient wean from crutches.    FALLS:  Has patient fallen in last 6  months? Yes. Number of falls 12/21/22.  LIVING ENVIRONMENT: Lives in: House/apartment Has following equipment at home: Crutches  OCCUPATION: Currently out of work due to injury.  PLOF: Independent  PATIENT GOALS: Get ankle fixed and get back to work.  OBJECTIVE:   EDEMA:  Circumferential: 4 cms left > right.  Her lateral malleolus and distal Achilles is visibly swollen.  PALPATION: Tender along left distal lateral lower leg and lateral ligamentous region.  LOWER EXTREMITY ROM:  Active left ankle dorsiflexion limited to -8 degrees from neutral with knee in full extension and 2 degrees with knee flexed.  Plantarflexion is 40 degrees. Active eversion is -8 degrees.  Patient states she is doing the ankle ABC's at home.  LOWER EXTREMITY MMT: Left ankle strength testing deferred due to high pain-level.  GAIT: Patient with bilateral axillary crutches and left ASO with attempts to wbat.                                   04/15/23   EXERCISE LOG    LT ankle  Exercise Repetitions and Resistance Comments  Nustep L3 x15 min   Seated rockerboard X3 min   Seated dynadisc circles X5 min   LT HIP IR and ER side lying on RT 2x20 each   Hip ABD 2xfatigue   Bridging  x10   Hooklying Hip ADD  x 5 hold 5 secs   ABCs    Toe raise with ball squeeze    Heel raise with ball squeeze    Resisted eversion     Blank cell = exercise not performed today   Modalities  Date: 04/12/23 Unattended Estim: Ankle, IFC 80-150 Hz, 10 mins, Pain Vaso: Ankle, 34 degrees; low pressure, 10 mins, Pain and Edema  PATIENT EDUCATION:   HOME EXERCISE PROGRAM:  ASSESSMENT:  CLINICAL IMPRESSION: Pt arrives for today's treatment session reporting 8-9/10 left ankle again and will call MD office about seeing Dr sooner. Rx focused on LT ankle ROM exs as well as hip strengthening.     OBJECTIVE IMPAIRMENTS: Abnormal gait, decreased activity tolerance, difficulty walking, decreased ROM, increased edema, and pain.    ACTIVITY LIMITATIONS: carrying, lifting, bending, sleeping, bed mobility, and locomotion level  PARTICIPATION LIMITATIONS: meal prep, cleaning, laundry, occupation, and yard work  PERSONAL FACTORS: Time since onset of injury/illness/exacerbation and 1 comorbidity: h/o low back pain  are also affecting patient's functional outcome.   REHAB POTENTIAL: Good-  CLINICAL DECISION MAKING: Stable/uncomplicated  EVALUATION COMPLEXITY: Low  GOALS:  SHORT TERM GOALS: Target date: 05/06/23 Ind with a HEP. Goal status: MET  2.  Increase left ankle dorsiflexion to 6- 8 degrees to normalize the patient's gait pattern.  Goal status: On going  3.  Progress to one axillary crutch wbat over her left LE with ASO.  Goal status: On going  4.  Perform ADL's with left ankle pain not > 3-4/10.  Goal status: On going  PLAN:  PT FREQUENCY: 2x/week  PT DURATION: 4 weeks  PLANNED INTERVENTIONS: Therapeutic exercises, Therapeutic activity, Neuromuscular re-education, Gait training, Patient/Family education, Self Care, Electrical stimulation, Vasopneumatic device, and Manual therapy  PLAN FOR NEXT SESSION: Begin with seated Rockerboard, sheet stretch to increase dorsiflexion, progress to one axillary crutch, BAPS, e'stim, vasopneumatic.     Candace Begue,CHRIS, PTA 04/15/2023, 9:35 AM

## 2023-04-19 ENCOUNTER — Ambulatory Visit: Payer: Medicaid Other | Admitting: Physical Therapy

## 2023-04-19 ENCOUNTER — Encounter: Payer: Self-pay | Admitting: Physical Therapy

## 2023-04-19 DIAGNOSIS — M25572 Pain in left ankle and joints of left foot: Secondary | ICD-10-CM

## 2023-04-19 DIAGNOSIS — M25672 Stiffness of left ankle, not elsewhere classified: Secondary | ICD-10-CM

## 2023-04-19 DIAGNOSIS — R600 Localized edema: Secondary | ICD-10-CM

## 2023-04-19 NOTE — Therapy (Signed)
OUTPATIENT PHYSICAL THERAPY LOWER EXTREMITY TREATMENT   Patient Name: Christina Murillo MRN: [id number] DOB:1976-08-07, 47 y.o., female Today's Date: 04/19/2023  END OF SESSION:  PT End of Session - 04/19/23 0940     Visit Number 6    Number of Visits 8    Date for PT Re-Evaluation 05/06/23    PT Start Time 0936    PT Stop Time 1024    PT Time Calculation (min) 48 min    Equipment Utilized During Treatment Other (comment)   B axillray crutches   Activity Tolerance Patient tolerated treatment well    Behavior During Therapy [facility] for tasks assessed/performed            Past Medical History:  Diagnosis Date   Anxiety    Asthma    Past Surgical History:  Procedure Laterality Date   ABDOMINAL HYSTERECTOMY     Patient Active Problem List   Diagnosis Date Noted   Low back pain 10/07/2020   PTSD (post-traumatic stress disorder) 08/26/2020   Chronic tension-type headache, not intractable 06/27/2019   Adhesive capsulitis of left shoulder 06/06/2017   Cigarette nicotine dependence without complication 05/29/2016   GAD (generalized anxiety disorder) 08/03/2013   REFERRING PROVIDER: Netta Cedars MD  REFERRING DIAG: Pain of left ankle joint.  THERAPY DIAG:  Pain in left ankle and joints of left foot  Stiffness of left ankle, not elsewhere classified  Localized edema  Rationale for Evaluation and Treatment: Rehabilitation  ONSET DATE: 12/21/22.  SUBJECTIVE:   SUBJECTIVE STATEMENT: Reports hurting more and having pain in L lumbar region/SI area again. Returns to MD today. L foot and ankle is still very sensitive especially lateral malleolus and surrounding area.  PERTINENT HISTORY: H/o chronic LBP and left SIJ injections.    PAIN:  Are you having pain? Yes: NPRS scale: 9/10 Pain location: Lateral left ankle and lower leg.   Pain description: Ache, throb Aggravating factors: Rolling over in bed, standing and walking.   Relieving factors: Ice and rest.     PRECAUTIONS: Other: Please supervise gait.    WEIGHT BEARING RESTRICTIONS: Per MD would like to see patient wean from crutches.    FALLS:  Has patient fallen in last 6 months? Yes. Number of falls 12/21/22.  LIVING ENVIRONMENT: Lives in: House/apartment Has following equipment at home: Crutches  OCCUPATION: Currently out of work due to injury.  PLOF: Independent  PATIENT GOALS: Get ankle fixed and get back to work.  OBJECTIVE:   EDEMA:  Greater anterior ankle edema observed. Patient did not wear ASO today due to tightness from swelling.  PALPATION: Tender along left distal lateral lower leg and lateral ligamentous region.  LOWER EXTREMITY ROM: Active left ankle dorsiflexion limited to -8 degrees from neutral with knee in full extension and 2 degrees with knee flexed.  Plantarflexion is 40 degrees. Active eversion is -8 degrees.  Patient states she is doing the ankle ABC's at home.  LOWER EXTREMITY MMT: Left ankle strength testing deferred due to high pain-level.  GAIT: Patient with bilateral axillary crutches and left ASO with attempts to wbat.                                   04/19/23   EXERCISE LOG    LT ankle  Exercise Repetitions and Resistance Comments  Nustep L3 x17 min   Seated rockerboard X3 min   Seated dynadisc circles X5 min   Seated  L heel raise X20 reps   Seated L toe raise X20 reps   Seated ankle eversion  No resistance x20 reps    Blank cell = exercise not performed today   Modalities  Date: 04/19/23 Unattended Estim: Ankle, IFC 80-150 Hz, 10 mins, Pain Vaso: Ankle, 34 degrees; low pressure, 10 mins, Pain and Edema  PATIENT EDUCATION:   HOME EXERCISE PROGRAM:  ASSESSMENT:  CLINICAL IMPRESSION: Patient presented in clinic with reports of increased pain of L ankle especially lateral aspect. Increased pain allowed for seated exercises only today and intermittent facial grimacing noted. Increased edema notable surrounding the L ankle. Normal  modalities response noted following removal of the modalities.  OBJECTIVE IMPAIRMENTS: Abnormal gait, decreased activity tolerance, difficulty walking, decreased ROM, increased edema, and pain.   ACTIVITY LIMITATIONS: carrying, lifting, bending, sleeping, bed mobility, and locomotion level  PARTICIPATION LIMITATIONS: meal prep, cleaning, laundry, occupation, and yard work  PERSONAL FACTORS: Time since onset of injury/illness/exacerbation and 1 comorbidity: h/o low back pain  are also affecting patient's functional outcome.   REHAB POTENTIAL: Good  CLINICAL DECISION MAKING: Stable/uncomplicated  EVALUATION COMPLEXITY: Low  GOALS:  SHORT TERM GOALS: Target date: 05/06/23 Ind with a HEP. Goal status: MET  2.  Increase left ankle dorsiflexion to 6- 8 degrees to normalize the patient's gait pattern.  Goal status: On going  3.  Progress to one axillary crutch wbat over her left LE with ASO.  Goal status: On going  4.  Perform ADL's with left ankle pain not > 3-4/10.  Goal status: On going  PLAN:  PT FREQUENCY: 2x/week  PT DURATION: 4 weeks  PLANNED INTERVENTIONS: Therapeutic exercises, Therapeutic activity, Neuromuscular re-education, Gait training, Patient/Family education, Self Care, Electrical stimulation, Vasopneumatic device, and Manual therapy  PLAN FOR NEXT SESSION: Begin with seated Rockerboard, sheet stretch to increase dorsiflexion, progress to one axillary crutch, BAPS, e'stim, vasopneumatic.    Marvell Fuller, PTA 04/19/2023, 10:24 AM

## 2023-04-21 ENCOUNTER — Ambulatory Visit: Payer: Medicaid Other | Admitting: Physical Therapy

## 2023-04-27 ENCOUNTER — Encounter (HOSPITAL_BASED_OUTPATIENT_CLINIC_OR_DEPARTMENT_OTHER): Payer: Self-pay | Admitting: Orthopaedic Surgery

## 2023-05-02 NOTE — H&P (Addendum)
ORTHOPAEDIC SURGERY H&P  Subjective:  The patient presents with LEFT ankle instability.   Past Medical History:  Diagnosis Date   Anxiety    Asthma     Past Surgical History:  Procedure Laterality Date   ABDOMINAL HYSTERECTOMY       (Not in an outpatient encounter)    Allergies  Allergen Reactions   Aleve [Naproxen] Other (See Comments)    Pt states "I was having spots on my liver, the doctor told me not to take it"   Percocet [Oxycodone-Acetaminophen] Itching    Social History   Socioeconomic History   Marital status: Divorced    Spouse name: Not on file   Number of children: Not on file   Years of education: Not on file   Highest education level: Not on file  Occupational History   Not on file  Tobacco Use   Smoking status: Some Days   Smokeless tobacco: Never  Substance and Sexual Activity   Alcohol use: No   Drug use: Not on file   Sexual activity: Yes  Other Topics Concern   Not on file  Social History Narrative   Not on file   Social Determinants of Health   Financial Resource Strain: Low Risk  (11/16/2022)   Received from [facility], [facility]   Overall Financial Resource Strain (CARDIA)    Difficulty of Paying Living Expenses: Not very hard  Food Insecurity: No Food Insecurity (11/16/2022)   Received from [facility], [facility]   Hunger Vital Sign    Worried About Running Out of Food in the Last Year: Never true    Ran Out of Food in the Last Year: Never true  Transportation Needs: No Transportation Needs (11/16/2022)   Received from [facility], [facility]   PRAPARE - Transportation    Lack of Transportation (Medical): No    Lack of Transportation (Non-Medical): No  Physical Activity: Sufficiently Active (11/16/2022)   Received from [facility], [facility]   Exercise Vital Sign    Days of Exercise per Week: 5 days    Minutes of Exercise per Session: 30 min  Stress: No Stress Concern Present (11/16/2022)   Received from  [facility], [facility] of Occupational Health - Occupational Stress Questionnaire    Feeling of Stress : Not at all  Social Connections: Socially Integrated (11/16/2022)   Received from [facility], [facility]   Social Network    How would you rate your social network (family, work, friends)?: Good participation with social networks  Intimate Partner Violence: Not At Risk (12/26/2022)   Received from [facility], [facility]   HITS    Over the last 12 months how often did your partner physically hurt you?: 1    Over the last 12 months how often did your partner insult you or talk down to you?: 1    Over the last 12 months how often did your partner threaten you with physical harm?: 1    Over the last 12 months how often did your partner scream or curse at you?: 1     History reviewed. No pertinent family history.   Review of Systems Pertinent items are noted in HPI.  Objective: Vital signs in last 24 hours:    04/27/2023    4:13 PM 03/07/2021    9:45 AM 03/07/2021    9:40 AM  Vitals with BMI  Height 5\' 9"     Weight 190 lbs  BMI 28.05    Systolic  137 136  Diastolic  81 80  Pulse  80 80      EXAM: General: Well nourished, well developed. Awake, alert and oriented to time, place, person. Normal mood and affect. No apparent distress. Breathing room air.  Operative Lower Extremity: Alignment - Neutral Deformity - None Skin intact Tenderness to palpation - LEFT ankle LLC 5/5 TA, PT, GS, Per, EHL, FHL Sensation intact to light touch throughout Palpable DP and PT pulses Special testing: LEFT ankle instability  The contralateral foot/ankle was examined for comparison and noted to be neurovascularly intact with no localized deformity, swelling, or tenderness.  Imaging Review All images taken were independently reviewed by me.  Assessment/Plan: The clinical and radiographic findings were reviewed and discussed at length with the  patient.  The patient has LEFT ankle instability.  We spoke at length about the natural course of these findings. We discussed nonoperative and operative treatment options in detail.  The risks and benefits were presented and reviewed. The risks due to arthroscopy, implant failure/irritation, infection, stiffness, nerve/vessel/tendon injury, wound healing issues, failure of this surgery, need for further surgery, thromboembolic events, amputation, death among others were discussed. The patient acknowledged the explanation and agreed to proceed with the plan.  Christina Murillo  Orthopaedic Surgery [facility]

## 2023-05-02 NOTE — Discharge Instructions (Signed)
Netta Cedars, MD [facility]  Please read the following information regarding your care after surgery.  Medications  You only need a prescription for the narcotic pain medicine (ex. oxycodone, Percocet, Norco).  All of the other medicines listed below are available over the counter. ? Aleve 2 pills twice a day for the first 3 days after surgery. ? acetominophen (Tylenol) 650 mg every 4-6 hours as you need for minor to moderate pain ? oxycodone as prescribed for severe pain  ? To help prevent blood clots, take aspirin (81 mg) twice daily for 42 days after surgery (or total duration of nonweightbearing).  You should also get up every hour while you are awake to move around.  Weight Bearing ? Do NOT bear any weight on the operated leg or foot. This means do NOT touch your surgical leg to the ground!  Cast / Splint / Dressing ? If you have a splint, do NOT remove this. Keep your splint, cast or dressing clean and dry.  Don't put anything (coat hanger, pencil, etc) down inside of it.  If it gets wet, call the office immediately to schedule an appointment for a cast change.  Swelling IMPORTANT: It is normal for you to have swelling where you had surgery. To reduce swelling and pain, keep at least 3 pillows under your leg so that your toes are above your nose and your heel is above the level of your hip.  It may be necessary to keep your foot or leg elevated for several weeks.  This is critical to helping your incisions heal and your pain to feel better.  Follow Up Call my office at [phone] when you are discharged from the hospital or surgery center to schedule an appointment to be seen 7-10 days after surgery.  Call my office at [phone] if you develop a fever >101.5 F, nausea, vomiting, bleeding from the surgical site or severe pain.     Post Anesthesia Home Care Instructions  Activity: Get plenty of rest for the remainder of the day. A responsible individual must stay with  you for 24 hours following the procedure.  For the next 24 hours, DO NOT: -Drive a car -Advertising copywriter -Drink alcoholic beverages -Take any medication unless instructed by your physician -Make any legal decisions or sign important papers.  Meals: Start with liquid foods such as gelatin or soup. Progress to regular foods as tolerated. Avoid greasy, spicy, heavy foods. If nausea and/or vomiting occur, drink only clear liquids until the nausea and/or vomiting subsides. Call your physician if vomiting continues.  Special Instructions/Symptoms: Your throat may feel dry or sore from the anesthesia or the breathing tube placed in your throat during surgery. If this causes discomfort, gargle with warm salt water. The discomfort should disappear within 24 hours.  If you had a scopolamine patch placed behind your ear for the management of post- operative nausea and/or vomiting:  1. The medication in the patch is effective for 72 hours, after which it should be removed.  Wrap patch in a tissue and discard in the trash. Wash hands thoroughly with soap and water. 2. You may remove the patch earlier than 72 hours if you experience unpleasant side effects which may include dry mouth, dizziness or visual disturbances. 3. Avoid touching the patch. Wash your hands with soap and water after contact with the patch.     Regional Anesthesia Blocks  1. You may not be able to move or feel the "blocked" extremity after a regional anesthetic  block. This may last may last from 3-48 hours after placement, but it will go away. The length of time depends on the medication injected and your individual response to the medication. As the nerves start to wake up, you may experience tingling as the movement and feeling returns to your extremity. If the numbness and inability to move your extremity has not gone away after 48 hours, please call your surgeon.   2. The extremity that is blocked will need to be protected until  the numbness is gone and the strength has returned. Because you cannot feel it, you will need to take extra care to avoid injury. Because it may be weak, you may have difficulty moving it or using it. You may not know what position it is in without looking at it while the block is in effect.  3. For blocks in the legs and feet, returning to weight bearing and walking needs to be done carefully. You will need to wait until the numbness is entirely gone and the strength has returned. You should be able to move your leg and foot normally before you try and bear weight or walk. You will need someone to be with you when you first try to ensure you do not fall and possibly risk injury.  4. Bruising and tenderness at the needle site are common side effects and will resolve in a few days.  5. Persistent numbness or new problems with movement should be communicated to the surgeon or the [facility] [phone] [facility] [phone]).  Information for Discharge Teaching: EXPAREL (bupivacaine liposome injectable suspension)   Pain relief is important to your recovery. The goal is to control your pain so you can move easier and return to your normal activities as soon as possible after your procedure. Your physician may use several types of medicines to manage pain, swelling, and more.  Your surgeon or anesthesiologist gave you EXPAREL(bupivacaine) to help control your pain after surgery.  EXPAREL is a local anesthetic designed to release slowly over an extended period of time to provide pain relief by numbing the tissue around the surgical site. EXPAREL is designed to release pain medication over time and can control pain for up to 72 hours. Depending on how you respond to EXPAREL, you may require less pain medication during your recovery. EXPAREL can help reduce or eliminate the need for opioids during the first few days after surgery when pain relief is needed the  most. EXPAREL is not an opioid and is not addictive. It does not cause sleepiness or sedation.   Important! A teal colored band has been placed on your arm with the date, time and amount of EXPAREL you have received. Please leave this armband in place for the full 96 hours following administration, and then you may remove the band. If you return to the hospital for any reason within 96 hours following the administration of EXPAREL, the armband provides important information that your health care providers to know, and alerts them that you have received this anesthetic.    Possible side effects of EXPAREL: Temporary loss of sensation or ability to move in the area where medication was injected. Nausea, vomiting, constipation Rarely, numbness and tingling in your mouth or lips, lightheadedness, or anxiety may occur. Call your doctor right away if you think you may be experiencing any of these sensations, or if you have other questions regarding possible side effects.  Follow all other discharge instructions given to  you by your surgeon or nurse. Eat a healthy diet and drink plenty of water or other fluids.

## 2023-05-02 NOTE — Anesthesia Preprocedure Evaluation (Addendum)
Anesthesia Evaluation  Patient identified by MRN, date of birth, ID band Patient awake    Reviewed: Allergy & Precautions, H&P , NPO status , Patient's Chart, lab work & pertinent test results  Airway Mallampati: II  TM Distance: >3 FB Neck ROM: Full    Dental no notable dental hx.    Pulmonary asthma , Current Smoker   Pulmonary exam normal breath sounds clear to auscultation       Cardiovascular Exercise Tolerance: Good negative cardio ROS Normal cardiovascular exam Rhythm:Regular Rate:Normal     Neuro/Psych  Headaches PSYCHIATRIC DISORDERS Anxiety     negative neurological ROS  negative psych ROS   GI/Hepatic negative GI ROS, Neg liver ROS,,,  Endo/Other  negative endocrine ROS    Renal/GU negative Renal ROS  negative genitourinary   Musculoskeletal negative musculoskeletal ROS (+) Arthritis ,    Abdominal   Peds negative pediatric ROS (+)  Hematology negative hematology ROS (+)   Anesthesia Other Findings   Reproductive/Obstetrics negative OB ROS                              Anesthesia Physical Anesthesia Plan  ASA: 2  Anesthesia Plan: General and Regional   Post-op Pain Management: Minimal or no pain anticipated and Regional block*   Induction: Intravenous  PONV Risk Score and Plan: 3 and Midazolam, Ondansetron, Dexamethasone and Treatment may vary due to age or medical condition  Airway Management Planned: LMA and Simple Face Mask  Additional Equipment: None  Intra-op Plan:   Post-operative Plan: Extubation in OR  Informed Consent: I have reviewed the patients History and Physical, chart, labs and discussed the procedure including the risks, benefits and alternatives for the proposed anesthesia with the patient or authorized representative who has indicated his/her understanding and acceptance.     Dental advisory given  Plan Discussed with: Anesthesiologist and  CRNA  Anesthesia Plan Comments: (Discussed both nerve block for pain relief post-op and GA; including NV, sore throat, dental injury, and pulmonary complications)         Anesthesia Quick Evaluation

## 2023-05-03 ENCOUNTER — Encounter (HOSPITAL_BASED_OUTPATIENT_CLINIC_OR_DEPARTMENT_OTHER)
Admission: RE | Disposition: A | Discharge status: Home / Self Care | Payer: Self-pay | Source: Ambulatory Visit | Attending: Orthopaedic Surgery

## 2023-05-03 ENCOUNTER — Other Ambulatory Visit: Payer: Self-pay

## 2023-05-03 ENCOUNTER — Ambulatory Visit (HOSPITAL_BASED_OUTPATIENT_CLINIC_OR_DEPARTMENT_OTHER): Payer: Medicaid Other | Admitting: Anesthesiology

## 2023-05-03 ENCOUNTER — Ambulatory Visit (HOSPITAL_BASED_OUTPATIENT_CLINIC_OR_DEPARTMENT_OTHER)
Admission: RE | Admit: 2023-05-03 | Discharge: 2023-05-03 | Disposition: A | Discharge status: Home / Self Care | Payer: Medicaid Other | Source: Ambulatory Visit | Attending: Orthopaedic Surgery | Admitting: Orthopaedic Surgery

## 2023-05-03 ENCOUNTER — Ambulatory Visit (HOSPITAL_BASED_OUTPATIENT_CLINIC_OR_DEPARTMENT_OTHER): Payer: Self-pay | Admitting: Anesthesiology

## 2023-05-03 ENCOUNTER — Encounter (HOSPITAL_BASED_OUTPATIENT_CLINIC_OR_DEPARTMENT_OTHER): Payer: Self-pay | Admitting: Orthopaedic Surgery

## 2023-05-03 DIAGNOSIS — J45909 Unspecified asthma, uncomplicated: Secondary | ICD-10-CM | POA: Diagnosis not present

## 2023-05-03 DIAGNOSIS — M25372 Other instability, left ankle: Secondary | ICD-10-CM | POA: Insufficient documentation

## 2023-05-03 DIAGNOSIS — F17200 Nicotine dependence, unspecified, uncomplicated: Secondary | ICD-10-CM | POA: Insufficient documentation

## 2023-05-03 DIAGNOSIS — S93491A Sprain of other ligament of right ankle, initial encounter: Secondary | ICD-10-CM

## 2023-05-03 DIAGNOSIS — M1990 Unspecified osteoarthritis, unspecified site: Secondary | ICD-10-CM | POA: Diagnosis not present

## 2023-05-03 HISTORY — PX: ANKLE ARTHROSCOPY: SHX545

## 2023-05-03 HISTORY — PX: REPAIR OF PERONEUS BREVIS TENDON: SHX6215

## 2023-05-03 SURGERY — ARTHROSCOPY, ANKLE
Anesthesia: Regional | Site: Ankle | Laterality: Right

## 2023-05-03 MED ORDER — ONDANSETRON HCL 4 MG/2ML IJ SOLN: 4.0000 mg | Freq: Once | INTRAMUSCULAR | Status: DC | PRN

## 2023-05-03 MED ORDER — CHLORHEXIDINE GLUCONATE 4 % EX SOLN: 60.0000 mL | Freq: Once | CUTANEOUS | Status: DC

## 2023-05-03 MED ORDER — LACTATED RINGERS IV SOLN: INTRAVENOUS | Status: DC

## 2023-05-03 MED ORDER — ONDANSETRON HCL 4 MG/2ML IJ SOLN
INTRAMUSCULAR | Status: AC
  Filled 2023-05-03: qty 2

## 2023-05-03 MED ORDER — ACETAMINOPHEN 325 MG PO TABS: 325.0000 mg | ORAL_TABLET | ORAL | Status: DC | PRN

## 2023-05-03 MED ORDER — PROPOFOL 10 MG/ML IV BOLUS
INTRAVENOUS | Status: AC
  Filled 2023-05-03: qty 20

## 2023-05-03 MED ORDER — BUPIVACAINE LIPOSOME 1.3 % IJ SUSP
INTRAMUSCULAR | Status: DC | PRN
Start: 2023-05-03 — End: 2023-05-03
  Administered 2023-05-03: 10 mL via PERINEURAL

## 2023-05-03 MED ORDER — ACETAMINOPHEN 160 MG/5ML PO SOLN: 325.0000 mg | ORAL | Status: DC | PRN

## 2023-05-03 MED ORDER — BUPIVACAINE HCL (PF) 0.5 % IJ SOLN
INTRAMUSCULAR | Status: DC | PRN
Start: 2023-05-03 — End: 2023-05-03
  Administered 2023-05-03: 15 mL

## 2023-05-03 MED ORDER — OXYCODONE HCL 5 MG/5ML PO SOLN: 5.0000 mg | Freq: Once | ORAL | Status: DC | PRN

## 2023-05-03 MED ORDER — EPHEDRINE SULFATE (PRESSORS) 50 MG/ML IJ SOLN
INTRAMUSCULAR | Status: DC | PRN
Start: 2023-05-03 — End: 2023-05-03
  Administered 2023-05-03 (×2): 5 mg via INTRAVENOUS
  Administered 2023-05-03: 10 mg via INTRAVENOUS

## 2023-05-03 MED ORDER — ONDANSETRON HCL 4 MG/2ML IJ SOLN
INTRAMUSCULAR | Status: DC | PRN
  Administered 2023-05-03: 4 mg via INTRAVENOUS

## 2023-05-03 MED ORDER — LIDOCAINE 2% (20 MG/ML) 5 ML SYRINGE
INTRAMUSCULAR | Status: AC
  Filled 2023-05-03: qty 5

## 2023-05-03 MED ORDER — CEFAZOLIN SODIUM-DEXTROSE 2-4 GM/100ML-% IV SOLN
2.0000 g | INTRAVENOUS | Status: AC
  Administered 2023-05-03: 2 g via INTRAVENOUS

## 2023-05-03 MED ORDER — MEPERIDINE HCL 25 MG/ML IJ SOLN: 6.2500 mg | INTRAMUSCULAR | Status: DC | PRN

## 2023-05-03 MED ORDER — FENTANYL CITRATE (PF) 100 MCG/2ML IJ SOLN
INTRAMUSCULAR | Status: AC
  Filled 2023-05-03: qty 2

## 2023-05-03 MED ORDER — 0.9 % SODIUM CHLORIDE (POUR BTL) OPTIME
TOPICAL | Status: DC | PRN
  Administered 2023-05-03: 200 mL

## 2023-05-03 MED ORDER — DEXAMETHASONE SODIUM PHOSPHATE 10 MG/ML IJ SOLN
INTRAMUSCULAR | Status: DC | PRN
Start: 2023-05-03 — End: 2023-05-03
  Administered 2023-05-03: 10 mg via INTRAVENOUS

## 2023-05-03 MED ORDER — CEFAZOLIN SODIUM-DEXTROSE 2-4 GM/100ML-% IV SOLN
INTRAVENOUS | Status: AC
  Filled 2023-05-03: qty 100

## 2023-05-03 MED ORDER — PROPOFOL 10 MG/ML IV BOLUS
INTRAVENOUS | Status: DC | PRN
  Administered 2023-05-03: 200 mg via INTRAVENOUS

## 2023-05-03 MED ORDER — DEXAMETHASONE SODIUM PHOSPHATE 10 MG/ML IJ SOLN
INTRAMUSCULAR | Status: AC
  Filled 2023-05-03: qty 1

## 2023-05-03 MED ORDER — POVIDONE-IODINE 10 % EX SOLN
CUTANEOUS | Status: DC | PRN
Start: 2023-05-03 — End: 2023-05-03
  Administered 2023-05-03: 1 via TOPICAL

## 2023-05-03 MED ORDER — VANCOMYCIN HCL 500 MG IV SOLR
INTRAVENOUS | Status: AC
  Filled 2023-05-03: qty 30

## 2023-05-03 MED ORDER — MIDAZOLAM HCL 2 MG/2ML IJ SOLN
2.0000 mg | Freq: Once | INTRAMUSCULAR | Status: AC
  Administered 2023-05-03: 2 mg via INTRAVENOUS

## 2023-05-03 MED ORDER — FENTANYL CITRATE (PF) 100 MCG/2ML IJ SOLN: 25.0000 ug | INTRAMUSCULAR | Status: DC | PRN

## 2023-05-03 MED ORDER — OXYCODONE HCL 5 MG PO TABS: 5.0000 mg | ORAL_TABLET | Freq: Once | ORAL | Status: DC | PRN

## 2023-05-03 MED ORDER — FENTANYL CITRATE (PF) 100 MCG/2ML IJ SOLN
100.0000 ug | Freq: Once | INTRAMUSCULAR | Status: AC
  Administered 2023-05-03: 100 ug via INTRAVENOUS

## 2023-05-03 MED ORDER — VANCOMYCIN HCL 500 MG IV SOLR
INTRAVENOUS | Status: DC | PRN
  Administered 2023-05-03: 500 mg

## 2023-05-03 MED ORDER — LIDOCAINE HCL (CARDIAC) PF 100 MG/5ML IV SOSY
PREFILLED_SYRINGE | INTRAVENOUS | Status: DC | PRN
  Administered 2023-05-03: 40 mg via INTRAVENOUS

## 2023-05-03 MED ORDER — MIDAZOLAM HCL 2 MG/2ML IJ SOLN
INTRAMUSCULAR | Status: AC
  Filled 2023-05-03: qty 2

## 2023-05-03 SURGICAL SUPPLY — 80 items
ANCH SUT NDL DX FBRTK STRL LF (Anchor) ×2 IMPLANT
ANCH SUT NDL STRL LF DX FBRTK (Anchor) ×2 IMPLANT
ANCHOR SUT FBRTK 1.3 SUTTAP (Anchor) IMPLANT
ANCHOR SUT FIBERTAK DX DBL (Anchor) IMPLANT
APL PRP STRL LF DISP 70% ISPRP (MISCELLANEOUS) ×2
ASCP RGD 125 NANO NDL SCP (MISCELLANEOUS) ×2
BANDAGE ESMARK 6X9 LF (GAUZE/BANDAGES/DRESSINGS) ×2 IMPLANT
BLADE AVERAGE 25X9 (BLADE) IMPLANT
BLADE SURG 15 STRL LF DISP TIS (BLADE) ×4 IMPLANT
BLADE SURG 15 STRL SS (BLADE) ×4
BNDG CMPR 5X4 CHSV STRCH STRL (GAUZE/BANDAGES/DRESSINGS) ×4
BNDG CMPR 5X4 KNIT ELC UNQ LF (GAUZE/BANDAGES/DRESSINGS) ×2
BNDG CMPR 6 X 5 YARDS HK CLSR (GAUZE/BANDAGES/DRESSINGS)
BNDG CMPR 9X6 STRL LF SNTH (GAUZE/BANDAGES/DRESSINGS) ×2
BNDG COHESIVE 4X5 TAN STRL LF (GAUZE/BANDAGES/DRESSINGS) ×4 IMPLANT
BNDG ELASTIC 4INX 5YD STR LF (GAUZE/BANDAGES/DRESSINGS) ×2 IMPLANT
BNDG ELASTIC 6INX 5YD STR LF (GAUZE/BANDAGES/DRESSINGS) IMPLANT
BNDG ESMARK 6X9 LF (GAUZE/BANDAGES/DRESSINGS) ×2
BNDG GAUZE DERMACEA FLUFF 4 (GAUZE/BANDAGES/DRESSINGS) ×2 IMPLANT
BNDG GZE DERMACEA 4 6PLY (GAUZE/BANDAGES/DRESSINGS) ×2
CANNULA NANOSCOPE SHORT (CANNULA) IMPLANT
CHLORAPREP W/TINT 26 (MISCELLANEOUS) ×2 IMPLANT
COVER BACK TABLE 60X90IN (DRAPES) ×2 IMPLANT
CUFF TOURN SGL QUICK 34 (TOURNIQUET CUFF)
CUFF TRNQT CYL 34X4.125X (TOURNIQUET CUFF) IMPLANT
DRAPE EXTREMITY T 121X128X90 (DISPOSABLE) ×2 IMPLANT
DRAPE OEC MINIVIEW 54X84 (DRAPES) IMPLANT
DRAPE U-SHAPE 47X51 STRL (DRAPES) ×2 IMPLANT
DRSG MEPITEL 4X7.2 (GAUZE/BANDAGES/DRESSINGS) ×2 IMPLANT
ELECT REM PT RETURN 9FT ADLT (ELECTROSURGICAL) ×2
ELECTRODE REM PT RTRN 9FT ADLT (ELECTROSURGICAL) ×2 IMPLANT
GAUZE PAD ABD 8X10 STRL (GAUZE/BANDAGES/DRESSINGS) ×4 IMPLANT
GAUZE SPONGE 4X4 12PLY STRL (GAUZE/BANDAGES/DRESSINGS) ×2 IMPLANT
GLOVE BIOGEL PI IND STRL 8 (GLOVE) ×4 IMPLANT
GLOVE SURG SS PI 7.5 STRL IVOR (GLOVE) ×4 IMPLANT
GOWN STRL REUS W/ TWL LRG LVL3 (GOWN DISPOSABLE) ×4 IMPLANT
GOWN STRL REUS W/TWL LRG LVL3 (GOWN DISPOSABLE) ×2
KIT FIBERTAK DX 1.6 DISP (KITS) IMPLANT
NANONEEDLE HIGHFLOW SHEATH 125 (SHEATH) ×2
NDL HYPO 22X1.5 SAFETY MO (MISCELLANEOUS) IMPLANT
NDL HYPO 25X1 1.5 SAFETY (NEEDLE) IMPLANT
NDL SUT 6 .5 CRC .975X.05 MAYO (NEEDLE) IMPLANT
NEEDLE HYPO 22X1.5 SAFETY MO (MISCELLANEOUS) IMPLANT
NEEDLE HYPO 25X1 1.5 SAFETY (NEEDLE) IMPLANT
NEEDLE MAYO TAPER (NEEDLE)
NS IRRIG 1000ML POUR BTL (IV SOLUTION) ×2 IMPLANT
PACK BASIN DAY SURGERY FS (CUSTOM PROCEDURE TRAY) ×2 IMPLANT
PAD CAST 4YDX4 CTTN HI CHSV (CAST SUPPLIES) ×2 IMPLANT
PADDING CAST ABS COTTON 4X4 ST (CAST SUPPLIES) IMPLANT
PADDING CAST COTTON 4X4 STRL (CAST SUPPLIES) ×2
PADDING CAST SYNTHETIC 4X4 STR (CAST SUPPLIES) ×4 IMPLANT
PASSER SUT SWANSON 36MM LOOP (INSTRUMENTS) IMPLANT
PENCIL SMOKE EVACUATOR (MISCELLANEOUS) ×2 IMPLANT
RETRIEVER SUT HEWSON (MISCELLANEOUS) IMPLANT
SANITIZER HAND PURELL FF 515ML (MISCELLANEOUS) ×2 IMPLANT
SCOPE NANONDL 125 (MISCELLANEOUS) IMPLANT
SCOPE NANONEEDLE 125 (MISCELLANEOUS) ×2 IMPLANT
SET IRRIG Y TYPE TUR BLADDER L (SET/KITS/TRAYS/PACK) IMPLANT
SHEATH NANONDL HIGHFLOW 125 (SHEATH) IMPLANT
SHEATH NANONEEDLE HIGHFLOW 125 (SHEATH) ×2 IMPLANT
SHEET MEDIUM DRAPE 40X70 STRL (DRAPES) ×2 IMPLANT
SLEEVE SCD COMPRESS KNEE MED (STOCKING) ×2 IMPLANT
SPIKE FLUID TRANSFER (MISCELLANEOUS) IMPLANT
SPLINT PLASTER CAST FAST 5X30 (CAST SUPPLIES) ×40 IMPLANT
SPONGE T-LAP 18X18 ~~LOC~~+RFID (SPONGE) ×2 IMPLANT
STOCKINETTE 6 STRL (DRAPES) ×2 IMPLANT
SUCTION TUBE FRAZIER 10FR DISP (SUCTIONS) ×2 IMPLANT
SUT ETHIBOND 2 OS 4 DA (SUTURE) IMPLANT
SUT ETHILON 2 0 FS 18 (SUTURE) ×2 IMPLANT
SUT FIBERWIRE 2-0 18 17.9 3/8 (SUTURE)
SUT MERSILENE 2.0 SH NDLE (SUTURE) IMPLANT
SUT MNCRL AB 3-0 PS2 18 (SUTURE) ×2 IMPLANT
SUT VIC AB 2-0 SH 27 (SUTURE)
SUT VIC AB 2-0 SH 27XBRD (SUTURE) IMPLANT
SUT VICRYL 0 SH 27 (SUTURE) IMPLANT
SUTURE FIBERWR 2-0 18 17.9 3/8 (SUTURE) IMPLANT
SYR BULB EAR ULCER 3OZ GRN STR (SYRINGE) ×2 IMPLANT
TOWEL GREEN STERILE FF (TOWEL DISPOSABLE) ×4 IMPLANT
TUBE CONNECTING 20X1/4 (TUBING) ×2 IMPLANT
UNDERPAD 30X36 HEAVY ABSORB (UNDERPADS AND DIAPERS) ×2 IMPLANT

## 2023-05-03 NOTE — Transfer of Care (Signed)
Immediate Anesthesia Transfer of Care Note  Patient: Christina Murillo  Procedure(s) Performed: ANKLE ARTHROSCOPY WITH OPEN BROSTROM PROCEDURE (Right: Ankle) EXPLORATION WITH DEBRIDEMENT AND BROSTRUM PROCEDURE (Right)  Patient Location: PACU  Anesthesia Type:GA combined with regional for post-op pain  Level of Consciousness: drowsy and patient cooperative  Airway & Oxygen Therapy: Patient Spontanous Breathing and Patient connected to face mask oxygen  Post-op Assessment: Report given to RN and Post -op Vital signs reviewed and stable  Post vital signs: Reviewed and stable  Last Vitals:  Vitals Value Taken Time  BP    Temp    Pulse 92 05/03/23 0856  Resp 20 05/03/23 0857  SpO2 94 % 05/03/23 0856  Vitals shown include unfiled device data.  Last Pain:  Vitals:   05/03/23 0625  PainSc: 6       Patients Stated Pain Goal: 3 (05/03/23 1610)  Complications: No notable events documented.

## 2023-05-03 NOTE — H&P (Signed)
H&P Update:  -History and Physical Reviewed  -Patient has been re-examined  -No change in the plan of care  -The risks and benefits were presented and reviewed. The risks due to arthroscopy, persistent instability, hardware/suture failure and/or irritation, new/persistent infection, stiffness, nerve/vessel/tendon injury or rerupture of repaired tendon, nonunion/malunion, allograft usage, wound healing issues, development of arthritis, failure of this surgery, possibility of delayed definitive surgery, need for further surgery, thromboembolic events, anesthesia/medical complications, amputation, death among others were discussed. The patient acknowledged the explanation, agreed to proceed with the plan and a consent was signed.  Netta Cedars

## 2023-05-03 NOTE — Anesthesia Procedure Notes (Signed)
Procedure Name: LMA Insertion Date/Time: 05/03/2023 7:29 AM  Performed by: Thornell Mule, CRNAPre-anesthesia Checklist: Patient identified, Emergency Drugs available, Suction available and Patient being monitored Patient Re-evaluated:Patient Re-evaluated prior to induction Oxygen Delivery Method: Circle system utilized Preoxygenation: Pre-oxygenation with 100% oxygen Induction Type: IV induction LMA: LMA inserted LMA Size: 4.0 Number of attempts: 1 Placement Confirmation: positive ETCO2 Tube secured with: Tape Dental Injury: Teeth and Oropharynx as per pre-operative assessment

## 2023-05-03 NOTE — Progress Notes (Signed)
Assisted Dr. Ambrose Pancoast with left, popliteal, ultrasound guided block. Side rails up, monitors on throughout procedure. See vital signs in flow sheet. Tolerated Procedure well.

## 2023-05-03 NOTE — Anesthesia Postprocedure Evaluation (Signed)
Anesthesia Post Note  Patient: Christina Murillo  Procedure(s) Performed: ANKLE ARTHROSCOPY WITH OPEN BROSTROM PROCEDURE (Right: Ankle) EXPLORATION WITH DEBRIDEMENT AND BROSTRUM PROCEDURE (Right)     Patient location during evaluation: PACU Anesthesia Type: Regional and General Level of consciousness: awake and alert Pain management: pain level controlled Vital Signs Assessment: post-procedure vital signs reviewed and stable Respiratory status: spontaneous breathing, nonlabored ventilation, respiratory function stable and patient connected to nasal cannula oxygen Cardiovascular status: blood pressure returned to baseline and stable Postop Assessment: no apparent nausea or vomiting Anesthetic complications: no   No notable events documented.  Last Vitals:  Vitals:   05/03/23 0945 05/03/23 1002  BP: 121/75 119/60  Pulse: 69 76  Resp: 13 20  Temp:  (!) 36.2 C  SpO2: 93% 95%    Last Pain:  Vitals:   05/03/23 1002  TempSrc: Temporal  PainSc: 0-No pain                 Teva Bronkema

## 2023-05-03 NOTE — Anesthesia Procedure Notes (Signed)
Anesthesia Regional Block: Popliteal block   Pre-Anesthetic Checklist: , timeout performed,  Correct Patient, Correct Site, Correct Laterality,  Correct Procedure, Correct Position, site marked,  Risks and benefits discussed,  Surgical consent,  Pre-op evaluation,  At surgeon's request and post-op pain management  Laterality: Left  Prep: chloraprep       Needles:  Injection technique: Single-shot  Needle Type: Echogenic Stimulator Needle     Needle Length: 5cm  Needle Gauge: 22     Additional Needles:   Procedures:, nerve stimulator,,, ultrasound used (permanent image in chart),,     Nerve Stimulator or Paresthesia:  Response: foot, 0.45 mA  Additional Responses:   Narrative:  Start time: 05/03/2023 7:00 AM End time: 05/03/2023 7:10 AM Injection made incrementally with aspirations every 5 mL.  Performed by: Personally  Anesthesiologist: Bethena Midget, MD  Additional Notes: Functioning IV was confirmed and monitors were applied.  A 50mm 22ga Arrow echogenic stimulator needle was used. Sterile prep and drape,hand hygiene and sterile gloves were used. Ultrasound guidance: relevant anatomy identified, needle position confirmed, local anesthetic spread visualized around nerve(s)., vascular puncture avoided.  Image printed for medical record. Negative aspiration and negative test dose prior to incremental administration of local anesthetic. The patient tolerated the procedure well.

## 2023-05-04 ENCOUNTER — Encounter (HOSPITAL_BASED_OUTPATIENT_CLINIC_OR_DEPARTMENT_OTHER): Payer: Self-pay | Admitting: Orthopaedic Surgery

## 2023-05-04 NOTE — Op Note (Signed)
05/03/2023  11:39 AM   PATIENT: Christina Murillo  47 y.o. female  MRN: [id number]   PRE-OPERATIVE DIAGNOSIS:   Left lateral ankle instability   POST-OPERATIVE DIAGNOSIS:   Same   PROCEDURE: 1] Left ankle arthroscopy with debridement  2] Left open lateral ankle stabilization 3] Left peroneal tendon debridement and tenosynovectomy of peroneus brevis and longus   SURGEON:  Netta Cedars, MD   ASSISTANT: None   ANESTHESIA: General, regional   EBL: Minimal   TOURNIQUET:    Total Tourniquet Time Documented: Thigh (Right) - 56 minutes Total: Thigh (Right) - 56 minutes    COMPLICATIONS: None apparent   DISPOSITION: Extubated, awake and stable to recovery.   INDICATION FOR PROCEDURE: The patient presented with above diagnosis.  We discussed the diagnosis, alternative treatment options, risks and benefits of the above surgical intervention, as well as alternative non-operative treatments. All questions/concerns were addressed and the patient/family demonstrated appropriate understanding of the diagnosis, the procedure, the postoperative course, and overall prognosis. The patient wished to proceed with surgical intervention and signed an informed surgical consent as such, in each others presence prior to surgery.   PROCEDURE IN DETAIL: After preoperative consent was obtained and the correct operative site was identified, the patient was brought to the operating room supine on stretcher and transferred onto operating table. General anesthesia was induced. Preoperative antibiotics were administered. Surgical timeout was taken. The patient was then positioned supine with an ipsilateral hip bump. The operative lower extremity was prepped and draped in standard sterile fashion with a tourniquet around the thigh. The extremity was exsanguinated and the tourniquet was inflated to 275 mmHg.  Routine evaluation of the ankle joint demonstrated gross lateral laxity with drawer  testing. Full dorsiflexion as well as plantarflexion was possible.   We began by insufflating the ankle joint thru anteromedial approach. The anteromedial portal was carefully established medial to the tibialis anterior tendon. The arthroscopic trochar with blunt was inserted and then camera placed. There was excellent visualization of the joint and routine diagnostic ankle arthroscopy was performed. Of note, there was mild synovitis throughout the joint noted. Mild chondral changes in the tibiotalar joint surfaces. No loose bodies were encountered and no anterior ankle impingement was identified on max dorsiflexion. The deltoid and syndesmosis ligaments were stressed and noted to be stable. Arthroscopic assisted debridement of the ankle joint was performed.    A standard curvilinear approach was made over the lateral ankle ligament complex and the distal lateral malleolus.    We began by windowing the approach posteriorly to access the peroneal tendons. The sheath was incised and tenosynovial fluid was readily evacuated. We then sequentially evaluated both the peroneus longus and brevis tendons. There was no instability of peroneal tendons to intraoperative stress testing. The peroneal tendon sheath was close with vicryl.    Dissection was the carried down to the level of the lateral ankle ligament complex. We sharply incised the capsule and the complex just distal to the tip of the fibula leaving a small cuff of tissue for repair after advancement. The proximal flap was elevated carefully off the lateral malleolus. We then used a rongeur to roughen the distal lateral malleolus. Two Arthrex FiberTak anchors were implanted using standard technique in the anatomic footprints of the ATFL and CFL ligaments. These were verified by manual stress to be well seated within bone. The suture needles were sequentially passed through the distal flap, tied, and then brought back proximally into the proximal flap were  they were  then tied.The foot was held in eversion throughout to set appropriate tension and protect the repair. Intraoperative ankle testing demonstrated improved stability of the newly reconstructed lateral ankle ligament complex.   The surgical sites were thoroughly irrigated. The tourniquet was deflated and hemostasis achieved. Betadine and vancomycin powder were applied. The deep layers were closed using 2-0 vicryl. The skin was closed without tension using 2-0 nylon suture.    The leg was cleaned with saline and sterile dressings with gauze were applied. A well padded bulky short leg splint was applied. The patient was awakened from anesthesia and transported to the recovery room in stable condition.      FOLLOW UP PLAN: -transfer to PACU, then home -strict NWB operative extremity, maximum elevation -maintain short leg splint until follow up -DVT ppx: Aspirin 81 mg twice daily while NWB -follow up as outpatient within 7-10 days for wound check with exchange of short leg splint to short leg cast -sutures out in 2-3 weeks in outpatient office   RADIOGRAPHS: None taken   [facility] Orthopaedic Surgery [facility]

## 2023-06-28 ENCOUNTER — Other Ambulatory Visit: Payer: Self-pay

## 2023-06-28 ENCOUNTER — Ambulatory Visit: Payer: Medicaid Other | Attending: Orthopaedic Surgery | Admitting: Physical Therapy

## 2023-06-28 DIAGNOSIS — M25572 Pain in left ankle and joints of left foot: Secondary | ICD-10-CM | POA: Diagnosis present

## 2023-06-28 DIAGNOSIS — R600 Localized edema: Secondary | ICD-10-CM | POA: Diagnosis present

## 2023-06-28 DIAGNOSIS — M25672 Stiffness of left ankle, not elsewhere classified: Secondary | ICD-10-CM | POA: Insufficient documentation

## 2023-06-28 NOTE — Therapy (Signed)
OUTPATIENT PHYSICAL THERAPY LOWER EXTREMITY EVALUATION   Patient Name: Christina Murillo MRN: [id number] DOB:[birth year], 47 y.o., female Today's Date: 06/28/2023  END OF SESSION:  PT End of Session - 06/28/23 0938     Visit Number 1    Number of Visits 12    Date for PT Re-Evaluation 08/09/23    PT Start Time 0853    PT Stop Time 0920    PT Time Calculation (min) 27 min    Activity Tolerance Patient tolerated treatment well    Behavior During Therapy [facility] for tasks assessed/performed             Past Medical History:  Diagnosis Date   Anxiety    Asthma    Past Surgical History:  Procedure Laterality Date   ABDOMINAL HYSTERECTOMY     ANKLE ARTHROSCOPY Right 05/03/2023   Procedure: ANKLE ARTHROSCOPY WITH OPEN BROSTROM PROCEDURE;  Surgeon: Netta Cedars, MD;  Location: [facility];  Service: Orthopedics;  Laterality: Right;   REPAIR OF PERONEUS BREVIS TENDON Right 05/03/2023   Procedure: EXPLORATION WITH DEBRIDEMENT AND BROSTRUM PROCEDURE;  Surgeon: Netta Cedars, MD;  Location: [facility];  Service: Orthopedics;  Laterality: Right;  General and Regional 90 MIN   Patient Active Problem List   Diagnosis Date Noted   Low back pain 10/07/2020   PTSD (post-traumatic stress disorder) 08/26/2020   Chronic tension-type headache, not intractable 06/27/2019   Adhesive capsulitis of left shoulder 06/06/2017   Cigarette nicotine dependence without complication 05/29/2016   GAD (generalized anxiety disorder) 08/03/2013    REFERRING PROVIDER: Netta Cedars MD  REFERRING DIAG: Sprain of ligament, left ankle.  S/p left ankle arthroscopic surgery.  THERAPY DIAG:  Pain in left ankle and joints of left foot  Localized edema  Stiffness of left ankle, not elsewhere classified  Rationale for Evaluation and Treatment: Rehabilitation  ONSET DATE: 11/2022.  SUBJECTIVE:   SUBJECTIVE STATEMENT: The patient presents to the clinic s/p left ankle  arthroscopic surgery performed on 05/03/23 which involved left ankle arthroscopy with debridement, left open lateral ankle stabilization and left peroneal tendon debridement and tenosynovectomy of peroneus brevis and longus.  She states she underwent serial casting in different left ankle position and is now in a CAM boot and can be wbat though today she is walking with bilateral axillary crutches and NWBing over her left LE. Previously she was using a knee scooter. She has been doing ankle pumps at home and ankle "ABC's."  She states, per surgeon, she can now transition to an ASO and one crutch wbat over her left LE and then progress to no assistive device.  PERTINENT HISTORY: H/o LBP. PAIN:  Are you having pain? Yes: NPRS scale: 5-6/10 Pain location: Left lateral ankle. Pain description: Burn, shooting. Aggravating factors: Moving, pressure, trying to stand. Relieving factors: Elevation.  PRECAUTIONS: Other: Progress per lateral ligament repair protocol.  Okay to be in an ASO and wbat with appropriate assistive device.    WEIGHT BEARING RESTRICTIONS: Yes Per patient she can progress with wbat with a left ASO donned.  FALLS:  Has patient fallen in last 6 months?  No falls since surgery.  LIVING ENVIRONMENT: Lives in: House/apartment Has following equipment at home:  Bilateral axillary crutches and left CAM boot.  PLOF: Independent  PATIENT GOALS: Walk and get around without pain. OBJECTIVE:  Note: Objective measures were completed at Evaluation unless otherwise noted.   OBSERVATION:  Scabbed over left lateral ligament area.  She is currently on an  antibiotic.  EDEMA:  Circumferential: 3 cms greater on left than right.    Localized edema in area of left lateral malleolus and scabbed over.    LOWER EXTREMITY ROM:  Patient performed active left ankle dorsiflexion with knee in full extension to 5 degrees, inversion to 8 degrees, eversion to 5 degrees and plantarflexion to 38  degrees.  LOWER EXTREMITY MMT:  NT.   GAIT: Patient presented to clinic NWBing over her left LE with a CAM boot donned and bilateral axillary crutches.  She will bring in her ASO next visit.   PATIENT EDUCATION:  Education details: We discussed gait progression. Person educated: Patient Education method: Explanation Education comprehension: verbalized understanding  HOME EXERCISE PROGRAM:   ASSESSMENT:  CLINICAL IMPRESSION: The patient presents to OPPT with s/p left ankle arthroscopic surgery performed on 05/03/23 which involved left ankle arthroscopy with debridement, left open lateral ankle stabilization and left peroneal tendon debridement and tenosynovectomy of peroneus brevis and longus.  She has been doing ankle pumps and and ankle "ABC's" at home.  Today, she presented to clinic NWBing over her left LE with a CAM boot donned and bilateral axillary crutches but can now be in an ASO, wbat and wean to one crutch.  She has an expected loss of range of motion at this time.  She has some remaining edema and a localized pocket of edema in the region of the lateral malleolus where a scab is present.  She is currently on an antibiotic.  Patient will benefit from skilled physical therapy intervention to address pain and deficits.  OBJECTIVE IMPAIRMENTS: Abnormal gait, decreased activity tolerance, difficulty walking, decreased ROM, increased edema, and pain.   ACTIVITY LIMITATIONS: standing and locomotion level  PARTICIPATION LIMITATIONS: meal prep, cleaning, laundry, shopping, and community activity  PERSONAL FACTORS: Time since onset of injury/illness/exacerbation are also affecting patient's functional outcome.   REHAB POTENTIAL: Excellent  CLINICAL DECISION MAKING: Stable/uncomplicated  EVALUATION COMPLEXITY: Moderate   GOALS: LONG TERM GOALS: Target date: 08/09/23.  Ind with an HEP.  Goal status: INITIAL  2.  Increase left ankle dorsiflexion to 8-10 degrees to normalize  the patient's gait pattern.  Goal status: INITIAL  3.  Increase left ankle strength to 4+ to 5/5 to increase stability for functional tasks.  Goal status: INITIAL  4.  Walk a community distance with pain  not > 3/10.  Goal status: INITIAL  5.  Perform ADL's with pain not > 3/10.  Goal status: INITIAL  PLAN:  PT FREQUENCY: 2x/week  PT DURATION: 6 weeks  PLANNED INTERVENTIONS: 97110-Therapeutic exercises, 97530- Therapeutic activity, [id number]- Neuromuscular re-education, 97535- Self Care, [id number]- Manual therapy, 97014- Electrical stimulation (unattended), 97016- Vasopneumatic device, 97035- Ultrasound, Patient/Family education, and Cryotherapy  PLAN FOR NEXT SESSION: GT with left ASO donned weaning to one axillary crutch, seated BAPS and Rockerboard, review ankle pumps and ankle "ABC's."   Chantalle Defilippo, [place], PT 06/28/2023, 12:04 PM

## 2023-07-01 ENCOUNTER — Ambulatory Visit: Payer: Medicaid Other | Admitting: *Deleted

## 2023-07-01 DIAGNOSIS — M25572 Pain in left ankle and joints of left foot: Secondary | ICD-10-CM | POA: Diagnosis not present

## 2023-07-01 DIAGNOSIS — M25672 Stiffness of left ankle, not elsewhere classified: Secondary | ICD-10-CM

## 2023-07-01 DIAGNOSIS — R600 Localized edema: Secondary | ICD-10-CM

## 2023-07-01 NOTE — Therapy (Signed)
OUTPATIENT PHYSICAL THERAPY LOWER EXTREMITY EVALUATION   Patient Name: Netra Postlethwait MRN: [id number] DOB:06-08-1976, 47 y.o., female Today's Date: 07/01/2023  END OF SESSION:  PT End of Session - 07/01/23 0951     Visit Number 2    Number of Visits 12    Date for PT Re-Evaluation 08/09/23    PT Start Time 0845    PT Stop Time 0935    PT Time Calculation (min) 50 min             Past Medical History:  Diagnosis Date   Anxiety    Asthma    Past Surgical History:  Procedure Laterality Date   ABDOMINAL HYSTERECTOMY     ANKLE ARTHROSCOPY Right 05/03/2023   Procedure: ANKLE ARTHROSCOPY WITH OPEN BROSTROM PROCEDURE;  Surgeon: Netta Cedars, MD;  Location: [facility];  Service: Orthopedics;  Laterality: Right;   REPAIR OF PERONEUS BREVIS TENDON Right 05/03/2023   Procedure: EXPLORATION WITH DEBRIDEMENT AND BROSTRUM PROCEDURE;  Surgeon: Netta Cedars, MD;  Location: [facility];  Service: Orthopedics;  Laterality: Right;  General and Regional 90 MIN   Patient Active Problem List   Diagnosis Date Noted   Low back pain 10/07/2020   PTSD (post-traumatic stress disorder) 08/26/2020   Chronic tension-type headache, not intractable 06/27/2019   Adhesive capsulitis of left shoulder 06/06/2017   Cigarette nicotine dependence without complication 05/29/2016   GAD (generalized anxiety disorder) 08/03/2013    REFERRING PROVIDER: Netta Cedars MD  REFERRING DIAG: Sprain of ligament, left ankle.  S/p left ankle arthroscopic surgery.  THERAPY DIAG:  Pain in left ankle and joints of left foot  Localized edema  Stiffness of left ankle, not elsewhere classified  Rationale for Evaluation and Treatment: Rehabilitation  ONSET DATE: 11/2022.  SUBJECTIVE:   SUBJECTIVE STATEMENT: The patient reports  7-8/10  LT ankle. PERTINENT HISTORY: H/o LBP. PAIN:  Are you having pain? Yes: NPRS scale: 7-8/10 Pain location: Left lateral ankle. Pain  description: Burn, shooting. Aggravating factors: Moving, pressure, trying to stand. Relieving factors: Elevation.  PRECAUTIONS: Other: Progress per lateral ligament repair protocol.  Okay to be in an ASO and wbat with appropriate assistive device.    WEIGHT BEARING RESTRICTIONS: Yes Per patient she can progress with wbat with a left ASO donned.  FALLS:  Has patient fallen in last 6 months?  No falls since surgery.  LIVING ENVIRONMENT: Lives in: House/apartment Has following equipment at home:  Bilateral axillary crutches and left CAM boot.  PLOF: Independent  PATIENT GOALS: Walk and get around without pain. OBJECTIVE: :   07-01-23                                    EXERCISE LOG   LT ankle  Exercise Repetitions and Resistance Comments  Seated Rocker board X PF/DF and EV/Inv x 4 mins   Seated Dyna disc X  6 mins CW/CCW                Blank cell = exercise not performed today  Manual PROM/AAROM LT ankle   Df and eversion Isometrics fr eversion Vaso on low LT foot x 10 mins for edema control Discussed gait practice at home with boot on and bil. Crutches and WBAT  LT foot due to Pt reporting that she hasn't put any wt on LT LE with the boot on yet and we will practice toe touch WB  with AFO on next Rx. Vaso x 10 mins on Low pressure LT foot      TREATMENT DATE Note: Objective measures were completed at Evaluation unless otherwise noted.   OBSERVATION:  Scabbed over left lateral ligament area.  She is currently on an antibiotic.  EDEMA:  Circumferential: 3 cms greater on left than right.    Localized edema in area of left lateral malleolus and scabbed over.    LOWER EXTREMITY ROM:  Patient performed active left ankle dorsiflexion with knee in full extension to 5 degrees, inversion to 8 degrees, eversion to 5 degrees and plantarflexion to 38 degrees.  LOWER EXTREMITY MMT:  NT.   GAIT: Patient presented to clinic NWBing over her left LE with a CAM boot donned  and bilateral axillary crutches.  She will bring in her ASO next visit.   PATIENT EDUCATION:  Education details: We discussed gait progression. Person educated: Patient Education method: Explanation Education comprehension: verbalized understanding  HOME EXERCISE PROGRAM:   ASSESSMENT:  CLINICAL IMPRESSION: The patient presents to OPPT with s/p left ankle arthroscopic surgery performed on 05/03/23. Rx focused on AROM exs LT foot in seated position with Rocker board and Dyna disc f/b Manual PROM and AAROM LT foot as well light isometrics. Vaso end of session on low pressure for edema control   OBJECTIVE IMPAIRMENTS: Abnormal gait, decreased activity tolerance, difficulty walking, decreased ROM, increased edema, and pain.   ACTIVITY LIMITATIONS: standing and locomotion level  PARTICIPATION LIMITATIONS: meal prep, cleaning, laundry, shopping, and community activity  PERSONAL FACTORS: Time since onset of injury/illness/exacerbation are also affecting patient's functional outcome.   REHAB POTENTIAL: Excellent  CLINICAL DECISION MAKING: Stable/uncomplicated  EVALUATION COMPLEXITY: Moderate   GOALS: LONG TERM GOALS: Target date: 08/09/23.  Ind with an HEP.  Goal status: INITIAL  2.  Increase left ankle dorsiflexion to 8-10 degrees to normalize the patient's gait pattern.  Goal status: INITIAL  3.  Increase left ankle strength to 4+ to 5/5 to increase stability for functional tasks.  Goal status: INITIAL  4.  Walk a community distance with pain  not > 3/10.  Goal status: INITIAL  5.  Perform ADL's with pain not > 3/10.  Goal status: INITIAL  PLAN:  PT FREQUENCY: 2x/week  PT DURATION: 6 weeks  PLANNED INTERVENTIONS: 97110-Therapeutic exercises, 97530- Therapeutic activity, [id number]- Neuromuscular re-education, 97535- Self Care, [id number]- Manual therapy, 97014- Electrical stimulation (unattended), 97016- Vasopneumatic device, 97035- Ultrasound, Patient/Family education, and  Cryotherapy  PLAN FOR NEXT SESSION: GT with left ASO donned weaning to one axillary crutch, seated BAPS and Rockerboard, review ankle pumps and ankle "ABC's."   Lia Vigilante,CHRIS, PTA 07/01/2023, 11:32 AM

## 2023-07-07 ENCOUNTER — Ambulatory Visit: Payer: Medicaid Other | Admitting: Physical Therapy

## 2023-07-09 ENCOUNTER — Ambulatory Visit: Payer: Medicaid Other | Admitting: *Deleted

## 2023-07-09 ENCOUNTER — Encounter: Payer: Self-pay | Admitting: *Deleted

## 2023-07-09 DIAGNOSIS — M25572 Pain in left ankle and joints of left foot: Secondary | ICD-10-CM | POA: Diagnosis not present

## 2023-07-09 DIAGNOSIS — M25672 Stiffness of left ankle, not elsewhere classified: Secondary | ICD-10-CM

## 2023-07-09 DIAGNOSIS — R600 Localized edema: Secondary | ICD-10-CM

## 2023-07-09 NOTE — Therapy (Signed)
OUTPATIENT PHYSICAL THERAPY LOWER EXTREMITY EVALUATION   Patient Name: Christina Murillo MRN: [id number] DOB:31-May-1976, 47 y.o., female Today's Date: 07/09/2023  END OF SESSION:  PT End of Session - 07/09/23 1019     Visit Number 3    Number of Visits 12    Date for PT Re-Evaluation 08/09/23    PT Start Time 1015    PT Stop Time 1114    PT Time Calculation (min) 59 min             Past Medical History:  Diagnosis Date   Anxiety    Asthma    Past Surgical History:  Procedure Laterality Date   ABDOMINAL HYSTERECTOMY     ANKLE ARTHROSCOPY Right 05/03/2023   Procedure: ANKLE ARTHROSCOPY WITH OPEN BROSTROM PROCEDURE;  Surgeon: Netta Cedars, MD;  Location: [facility];  Service: Orthopedics;  Laterality: Right;   REPAIR OF PERONEUS BREVIS TENDON Right 05/03/2023   Procedure: EXPLORATION WITH DEBRIDEMENT AND BROSTRUM PROCEDURE;  Surgeon: Netta Cedars, MD;  Location: [facility];  Service: Orthopedics;  Laterality: Right;  General and Regional 90 MIN   Patient Active Problem List   Diagnosis Date Noted   Low back pain 10/07/2020   PTSD (post-traumatic stress disorder) 08/26/2020   Chronic tension-type headache, not intractable 06/27/2019   Adhesive capsulitis of left shoulder 06/06/2017   Cigarette nicotine dependence without complication 05/29/2016   GAD (generalized anxiety disorder) 08/03/2013    REFERRING PROVIDER: Netta Cedars MD  REFERRING DIAG: Sprain of ligament, left ankle.  S/p left ankle arthroscopic surgery.  THERAPY DIAG:  Pain in left ankle and joints of left foot  Localized edema  Stiffness of left ankle, not elsewhere classified  Rationale for Evaluation and Treatment: Rehabilitation  ONSET DATE: 11/2022.  SUBJECTIVE:   SUBJECTIVE STATEMENT: The patient reports  5-6/10  LT ankle. Started WBAT LT foot with boot and crutches.  PERTINENT HISTORY: H/o LBP. PAIN:  Are you having pain? Yes: NPRS scale:  7-8/10 Pain location: Left lateral ankle. Pain description: Burn, shooting. Aggravating factors: Moving, pressure, trying to stand. Relieving factors: Elevation.  PRECAUTIONS: Other: Progress per lateral ligament repair protocol.  Okay to be in an ASO and wbat with appropriate assistive device.    WEIGHT BEARING RESTRICTIONS: Yes Per patient she can progress with wbat with a left ASO donned.  FALLS:  Has patient fallen in last 6 months?  No falls since surgery.  LIVING ENVIRONMENT: Lives in: House/apartment Has following equipment at home:  Bilateral axillary crutches and left CAM boot.  PLOF: Independent  PATIENT GOALS: Walk and get around without pain. OBJECTIVE: :   07-09-23                                    EXERCISE LOG   LT ankle  Exercise Repetitions and Resistance Comments  Seated Rocker board X PF/DF and EV/Inv x 4 mins   Seated Dyna disc X  6 mins CW/CCW   NUstep L1 x 10 mins seat 11            Blank cell = exercise not performed today  Manual PROM/AAROM LT ankle   Df and eversion, Achilles stretching   Vaso on low LT foot 10 mins for edema control Gait practice with boot on and bil. Crutches with WBAT  LT foot as well as TTWB with ASO on and Bil. Crutches. Worked on keeping foot  flat on the ground  and decreasing knee flexion habit.   Vaso x 10 mins on Low pressure LT foot      TREATMENT DATE Note: Objective measures were completed at Evaluation unless otherwise noted.   OBSERVATION:  Scabbed over left lateral ligament area.  She is currently on an antibiotic.  EDEMA:  Circumferential: 3 cms greater on left than right.    Localized edema in area of left lateral malleolus and scabbed over.    LOWER EXTREMITY ROM:  Patient performed active left ankle dorsiflexion with knee in full extension to 5 degrees, inversion to 8 degrees, eversion to 5 degrees and plantarflexion to 38 degrees.  LOWER EXTREMITY MMT:  NT.   GAIT: Patient presented to  clinic NWBing over her left LE with a CAM boot donned and bilateral axillary crutches.  She will bring in her ASO next visit.   PATIENT EDUCATION:  Education details: We discussed gait progression. Person educated: Patient Education method: Explanation Education comprehension: verbalized understanding  HOME EXERCISE PROGRAM:   ASSESSMENT:  CLINICAL IMPRESSION:  The Pt arrived today ambulating with boot and Bil. Crutches. She was able to continue with AROM/AAROM therex as well as practice gait with Bil. Crutches and ASO on LT foot.Focused on decreasing habitual Knee flexion during gait and tolerating her foot flat on the floor. She was also able to tolerate nustep with ASO on and did great.  OBJECTIVE IMPAIR MENTS: Abnormal gait, decreased activity tolerance, difficulty walking, decreased ROM, increased edema, and pain.   ACTIVITY LIMITATIONS: standing and locomotion level  PARTICIPATION LIMITATIONS: meal prep, cleaning, laundry, shopping, and community activity  PERSONAL FACTORS: Time since onset of injury/illness/exacerbation are also affecting patient's functional outcome.   REHAB POTENTIAL: Excellent  CLINICAL DECISION MAKING: Stable/uncomplicated  EVALUATION COMPLEXITY: Moderate   GOALS: LONG TERM GOALS: Target date: 08/09/23.  Ind with an HEP.  Goal status: INITIAL  2.  Increase left ankle dorsiflexion to 8-10 degrees to normalize the patient's gait pattern.  Goal status: INITIAL  3.  Increase left ankle strength to 4+ to 5/5 to increase stability for functional tasks.  Goal status: INITIAL  4.  Walk a community distance with pain  not > 3/10.  Goal status: INITIAL  5.  Perform ADL's with pain not > 3/10.  Goal status: INITIAL  PLAN:  PT FREQUENCY: 2x/week  PT DURATION: 6 weeks  PLANNED INTERVENTIONS: 97110-Therapeutic exercises, 97530- Therapeutic activity, [id number]- Neuromuscular re-education, 97535- Self Care, 08657- Manual therapy, 97014- Electrical  stimulation (unattended), 97016- Vasopneumatic device, 97035- Ultrasound, Patient/Family education, and Cryotherapy  PLAN FOR NEXT SESSION: GT with left ASO donned weaning to one axillary crutch, seated BAPS and Rockerboard, review ankle pumps and ankle "ABC's."   Allure Greaser,CHRIS, PTA 07/09/2023, 1:16 PM

## 2023-07-13 ENCOUNTER — Ambulatory Visit: Payer: Medicaid Other | Admitting: *Deleted

## 2023-07-13 ENCOUNTER — Encounter: Payer: Self-pay | Admitting: *Deleted

## 2023-07-13 DIAGNOSIS — M25672 Stiffness of left ankle, not elsewhere classified: Secondary | ICD-10-CM

## 2023-07-13 DIAGNOSIS — M25572 Pain in left ankle and joints of left foot: Secondary | ICD-10-CM | POA: Diagnosis not present

## 2023-07-13 DIAGNOSIS — R600 Localized edema: Secondary | ICD-10-CM

## 2023-07-13 NOTE — Therapy (Addendum)
OUTPATIENT PHYSICAL THERAPY LOWER EXTREMITY TREATMENT   Patient Name: Christina Murillo MRN: [id number] DOB:06/14/1976, 47 y.o., female Today's Date: 07/13/2023  END OF SESSION:  PT End of Session - 07/13/23 1353     Visit Number 4    Number of Visits 12    Date for PT Re-Evaluation 08/09/23    PT Start Time 1348    PT Stop Time 1438    PT Time Calculation (min) 50 min             Past Medical History:  Diagnosis Date   Anxiety    Asthma    Past Surgical History:  Procedure Laterality Date   ABDOMINAL HYSTERECTOMY     ANKLE ARTHROSCOPY Right 05/03/2023   Procedure: ANKLE ARTHROSCOPY WITH OPEN BROSTROM PROCEDURE;  Surgeon: Netta Cedars, MD;  Location: [facility];  Service: Orthopedics;  Laterality: Right;   REPAIR OF PERONEUS BREVIS TENDON Right 05/03/2023   Procedure: EXPLORATION WITH DEBRIDEMENT AND BROSTRUM PROCEDURE;  Surgeon: Netta Cedars, MD;  Location: [facility];  Service: Orthopedics;  Laterality: Right;  General and Regional 90 MIN   Patient Active Problem List   Diagnosis Date Noted   Low back pain 10/07/2020   PTSD (post-traumatic stress disorder) 08/26/2020   Chronic tension-type headache, not intractable 06/27/2019   Adhesive capsulitis of left shoulder 06/06/2017   Cigarette nicotine dependence without complication 05/29/2016   GAD (generalized anxiety disorder) 08/03/2013    REFERRING PROVIDER: Netta Cedars MD  REFERRING DIAG: Sprain of ligament, left ankle.  S/p left ankle arthroscopic surgery.  THERAPY DIAG:  Pain in left ankle and joints of left foot  Localized edema  Stiffness of left ankle, not elsewhere classified  Rationale for Evaluation and Treatment: Rehabilitation  ONSET DATE: 11/2022.  SUBJECTIVE:   SUBJECTIVE STATEMENT: Doing good.  Started WBAT LT foot with ASO and crutches. To MD Friday.Scooter rolled into my ankle  PERTINENT HISTORY: H/o LBP. PAIN:  Are you having pain? Yes:  NPRS scale: 6/10 Pain location: Left lateral ankle. Pain description: Burn, shooting. Aggravating factors: Moving, pressure, trying to stand. Relieving factors: Elevation.  PRECAUTIONS: Other: Progress per lateral ligament repair protocol.  Okay to be in an ASO and wbat with appropriate assistive device.    WEIGHT BEARING RESTRICTIONS: Yes Per patient she can progress with wbat with a left ASO donned.  FALLS:  Has patient fallen in last 6 months?  No falls since surgery.  LIVING ENVIRONMENT: Lives in: House/apartment Has following equipment at home:  Bilateral axillary crutches and left CAM boot.  PLOF: Independent  PATIENT GOALS: Walk and get around without pain. OBJECTIVE: :   07-13-23                                    EXERCISE LOG   LT ankle  Exercise Repetitions and Resistance Comments  Seated Rocker board X PF/DF and EV/Inv x 4 mins   Seated Dyna disc X  6 mins CW/CCW   NUstep L1 x 13  mins seat 9 ASO donned           Blank cell = exercise not performed today  Manual PROM/AAROM LT ankle   Df and eversion, Achilles stretching  Gait practice with ASO on and bil. Crutches with WBAT  LT foot    Vaso x 10 mins on Low pressure LT foot      TREATMENT DATE Note: Objective  measures were completed at Evaluation unless otherwise noted.   OBSERVATION:  Scabbed over left lateral ligament area.  She is currently on an antibiotic.  EDEMA:  Circumferential: 3 cms greater on left than right.    Localized edema in area of left lateral malleolus and scabbed over.    LOWER EXTREMITY ROM:  Patient performed active left ankle dorsiflexion with knee in full extension to 5 degrees, inversion to 8 degrees, eversion to 5 degrees and plantarflexion to 38 degrees.  LOWER EXTREMITY MMT:  NT.   GAIT: Patient presented to clinic NWBing over her left LE with a CAM boot donned and bilateral axillary crutches.  She will bring in her ASO next visit.   PATIENT EDUCATION:   Education details: We discussed gait progression. Person educated: Patient Education method: Explanation Education comprehension: verbalized understanding  HOME EXERCISE PROGRAM:   ASSESSMENT:  CLINICAL IMPRESSION: Pt arrived today doing fairly well with LT ankle. She is now walking with Bil. Crutches and ASO on LT ankle. She was able to continue with seated AROM exs with dyna disc and rocker board. Check ROM next visit    OBJECTIVE IMPAIR MENTS: Abnormal gait, decreased activity tolerance, difficulty walking, decreased ROM, increased edema, and pain.   ACTIVITY LIMITATIONS: standing and locomotion level  PARTICIPATION LIMITATIONS: meal prep, cleaning, laundry, shopping, and community activity  PERSONAL FACTORS: Time since onset of injury/illness/exacerbation are also affecting patient's functional outcome.   REHAB POTENTIAL: Excellent  CLINICAL DECISION MAKING: Stable/uncomplicated  EVALUATION COMPLEXITY: Moderate   GOALS: LONG TERM GOALS: Target date: 08/09/23.  Ind with an HEP.  Goal status: MET  2.  Increase left ankle dorsiflexion to 8-10 degrees to normalize the patient's gait pattern.  Goal status: On going  3.  Increase left ankle strength to 4+ to 5/5 to increase stability for functional tasks.  Goal status: On going  4.  Walk a community distance with pain  not > 3/10.  Goal status: On going  5.  Perform ADL's with pain not > 3/10.  Goal status: On going  PLAN:  PT FREQUENCY: 2x/week  PT DURATION: 6 weeks  PLANNED INTERVENTIONS: 97110-Therapeutic exercises, 97530- Therapeutic activity, [id number]- Neuromuscular re-education, 97535- Self Care, 16109- Manual therapy, 97014- Electrical stimulation (unattended), 97016- Vasopneumatic device, 97035- Ultrasound, Patient/Family education, and Cryotherapy  PLAN FOR NEXT SESSION: GT with left ASO donned weaning to one axillary crutch, seated BAPS and Rockerboard, review ankle pumps and ankle  "ABC's."   Aliyah Abeyta,CHRIS, PTA 07/13/2023, 3:32 PM

## 2023-07-16 ENCOUNTER — Ambulatory Visit: Payer: Medicaid Other | Admitting: *Deleted

## 2023-07-16 ENCOUNTER — Encounter: Payer: Self-pay | Admitting: *Deleted

## 2023-07-16 DIAGNOSIS — R600 Localized edema: Secondary | ICD-10-CM

## 2023-07-16 DIAGNOSIS — M25672 Stiffness of left ankle, not elsewhere classified: Secondary | ICD-10-CM

## 2023-07-16 DIAGNOSIS — M25572 Pain in left ankle and joints of left foot: Secondary | ICD-10-CM | POA: Diagnosis not present

## 2023-07-16 NOTE — Therapy (Signed)
OUTPATIENT PHYSICAL THERAPY LOWER EXTREMITY TREATMENT   Patient Name: Christina Murillo MRN: [id number] DOB:1976-06-24, 47 y.o., female Today's Date: 07/16/2023  END OF SESSION:  PT End of Session - 07/16/23 1116     Visit Number 5    Number of Visits 12    Date for PT Re-Evaluation 08/09/23    PT Start Time 1100    PT Stop Time 1157    PT Time Calculation (min) 57 min             Past Medical History:  Diagnosis Date   Anxiety    Asthma    Past Surgical History:  Procedure Laterality Date   ABDOMINAL HYSTERECTOMY     ANKLE ARTHROSCOPY Right 05/03/2023   Procedure: ANKLE ARTHROSCOPY WITH OPEN BROSTROM PROCEDURE;  Surgeon: Netta Cedars, MD;  Location: [facility];  Service: Orthopedics;  Laterality: Right;   REPAIR OF PERONEUS BREVIS TENDON Right 05/03/2023   Procedure: EXPLORATION WITH DEBRIDEMENT AND BROSTRUM PROCEDURE;  Surgeon: Netta Cedars, MD;  Location: [facility];  Service: Orthopedics;  Laterality: Right;  General and Regional 90 MIN   Patient Active Problem List   Diagnosis Date Noted   Low back pain 10/07/2020   PTSD (post-traumatic stress disorder) 08/26/2020   Chronic tension-type headache, not intractable 06/27/2019   Adhesive capsulitis of left shoulder 06/06/2017   Cigarette nicotine dependence without complication 05/29/2016   GAD (generalized anxiety disorder) 08/03/2013    REFERRING PROVIDER: Netta Cedars MD  REFERRING DIAG: Sprain of ligament, left ankle.  S/p left ankle arthroscopic surgery.  THERAPY DIAG:  Pain in left ankle and joints of left foot  Localized edema  Stiffness of left ankle, not elsewhere classified  Rationale for Evaluation and Treatment: Rehabilitation  ONSET DATE: 11/2022.  SUBJECTIVE:   SUBJECTIVE STATEMENT: Pain 8-9/10 lateral aspect LT ankle  WBAT LT foot with ASO and crutches. To MD today    PERTINENT HISTORY: H/o LBP. PAIN:  Are you having pain? Yes: NPRS scale:  8-9/10 Pain location: Left lateral ankle. Pain description: Burn, shooting. Aggravating factors: Moving, pressure, trying to stand. Relieving factors: Elevation.  PRECAUTIONS: Other: Progress per lateral ligament repair protocol.  Okay to be in an ASO and wbat with appropriate assistive device.    WEIGHT BEARING RESTRICTIONS: Yes Per patient she can progress with wbat with a left ASO donned.  FALLS:  Has patient fallen in last 6 months?  No falls since surgery.  LIVING ENVIRONMENT: Lives in: House/apartment Has following equipment at home:  Bilateral axillary crutches and left CAM boot.  PLOF: Independent  PATIENT GOALS: Walk and get around without pain. OBJECTIVE: :                                                                                                    07-16-23                                    EXERCISE LOG   LT ankle  Exercise Repetitions and Resistance Comments  Seated Rocker board X PF/DF and EV/Inv x 4 mins ASO off  Seated Dyna disc X  6 mins CW/CCW ASO OFF  NUstep L1 x 13  mins seat 9 ASO donned           Blank cell = exercise not performed today  Manual PROM/AAROM LT ankle   Df and eversion, Achilles stretching Vaso x 10 mins on Low pressure LT foot      TREATMENT DATE Note: Objective measures were completed at Evaluation unless otherwise noted.   OBSERVATION:  Scabbed over left lateral ligament area.  She is currently on an antibiotic.  EDEMA:  Circumferential: 3 cms greater on left than right.    Localized edema in area of left lateral malleolus and scabbed over.    LOWER EXTREMITY ROM:  Patient performed active left ankle dorsiflexion with knee in full extension to 5 degrees, inversion to 8 degrees, eversion to 5 degrees and plantarflexion to 38 degrees.  LOWER EXTREMITY MMT:  NT.   GAIT: Patient presented to clinic NWBing over her left LE with a CAM boot donned and bilateral axillary crutches.  She will bring in her ASO next  visit.   PATIENT EDUCATION:  Education details: We discussed gait progression. Person educated: Patient Education method: Explanation Education comprehension: verbalized understanding  HOME EXERCISE PROGRAM:   ASSESSMENT:  CLINICAL IMPRESSION: Pt arrived today doing fair with LT ankle. Her pain was very high today 8-9/10.  She is now walking with Bil. Crutches and ASO on LT ankle, but unable to progress due to pain. She was able to continue with seated AROM exs with dyna disc and rocker board as well towel scrunches and eversion/inversion exs for HEP. Manual AAROM and passive achilles stretching provided. AROM DF  5 (10 PROM) degrees, eversion 5 degrees and 10 degrees PROM    OBJECTIVE IMPAIR MENTS: Abnormal gait, decreased activity tolerance, difficulty walking, decreased ROM, increased edema, and pain.   ACTIVITY LIMITATIONS:   PARTICIPATION LIMITATIONS: meal prep, cleaning, laundry, shopping, and community activity  PERSONAL FACTORS: Time since onset of injury/illness/exacerbation are also affecting patient's functional outcome.   REHAB POTENTIAL: Excellent  CLINICAL DECISION MAKING: Stable/uncomplicated  EVALUATION COMPLEXITY: Moderate   GOALS: LONG TERM GOALS: Target date: 08/09/23.  Ind with an HEP.  Goal status: MET  2.  Increase left ankle dorsiflexion to 8-10 degrees to normalize the patient's gait pattern.  Goal status: On going  3.  Increase left ankle strength to 4+ to 5/5 to increase stability for functional tasks.  Goal status: On going  4.  Walk a community distance with pain  not > 3/10.  Goal status: On going  5.  Perform ADL's with pain not > 3/10.  Goal status: On going  PLAN:  PT FREQUENCY: 2x/week  PT DURATION: 6 weeks  PLANNED INTERVENTIONS: 97110-Therapeutic exercises, 97530- Therapeutic activity, [id number]- Neuromuscular re-education, 97535- Self Care, 46962- Manual therapy, 97014- Electrical stimulation (unattended), 97016- Vasopneumatic  device, 97035- Ultrasound, Patient/Family education, and Cryotherapy  PLAN FOR NEXT SESSION: GT with left ASO donned weaning to one axillary crutch, seated BAPS and Rockerboard, review ankle pumps and ankle "ABC's."   Aladdin Kollmann,CHRIS, PTA 07/16/2023, 12:49 PM

## 2023-07-19 ENCOUNTER — Ambulatory Visit: Payer: Medicaid Other | Admitting: Physical Therapy

## 2023-07-19 DIAGNOSIS — M25672 Stiffness of left ankle, not elsewhere classified: Secondary | ICD-10-CM

## 2023-07-19 DIAGNOSIS — M25572 Pain in left ankle and joints of left foot: Secondary | ICD-10-CM | POA: Diagnosis not present

## 2023-07-19 DIAGNOSIS — R600 Localized edema: Secondary | ICD-10-CM

## 2023-07-19 NOTE — Therapy (Signed)
OUTPATIENT PHYSICAL THERAPY LOWER EXTREMITY TREATMENT   Patient Name: Christina Murillo MRN: [id number] DOB:10-Sep-1975, 47 y.o., female Today's Date: 07/19/2023  END OF SESSION:  PT End of Session - 07/19/23 1523     Visit Number 6    Number of Visits 12    Date for PT Re-Evaluation 08/09/23    PT Start Time 0315    PT Stop Time 0404    PT Time Calculation (min) 49 min    Activity Tolerance Patient tolerated treatment well    Behavior During Therapy [facility] for tasks assessed/performed             Past Medical History:  Diagnosis Date   Anxiety    Asthma    Past Surgical History:  Procedure Laterality Date   ABDOMINAL HYSTERECTOMY     ANKLE ARTHROSCOPY Right 05/03/2023   Procedure: ANKLE ARTHROSCOPY WITH OPEN BROSTROM PROCEDURE;  Surgeon: Netta Cedars, MD;  Location: [facility];  Service: Orthopedics;  Laterality: Right;   REPAIR OF PERONEUS BREVIS TENDON Right 05/03/2023   Procedure: EXPLORATION WITH DEBRIDEMENT AND BROSTRUM PROCEDURE;  Surgeon: Netta Cedars, MD;  Location: [facility];  Service: Orthopedics;  Laterality: Right;  General and Regional 90 MIN   Patient Active Problem List   Diagnosis Date Noted   Low back pain 10/07/2020   PTSD (post-traumatic stress disorder) 08/26/2020   Chronic tension-type headache, not intractable 06/27/2019   Adhesive capsulitis of left shoulder 06/06/2017   Cigarette nicotine dependence without complication 05/29/2016   GAD (generalized anxiety disorder) 08/03/2013    REFERRING PROVIDER: Netta Cedars MD  REFERRING DIAG: Sprain of ligament, left ankle.  S/p left ankle arthroscopic surgery.  THERAPY DIAG:  Pain in left ankle and joints of left foot  Localized edema  Stiffness of left ankle, not elsewhere classified  Rationale for Evaluation and Treatment: Rehabilitation  ONSET DATE: 11/2022.  SUBJECTIVE:   SUBJECTIVE STATEMENT: Pain about a 5.  Doctor removed scab.  Thinks I  have nerve damage.  PERTINENT HISTORY: H/o LBP. PAIN:  Are you having pain? Yes: NPRS scale: 8-9/10 Pain location: Left lateral ankle. Pain description: Burn, shooting. Aggravating factors: Moving, pressure, trying to stand. Relieving factors: Elevation.  PRECAUTIONS: Other: Progress per lateral ligament repair protocol.  Okay to be in an ASO and wbat with appropriate assistive device.    WEIGHT BEARING RESTRICTIONS: Yes Per patient she can progress with wbat with a left ASO donned.  FALLS:  Has patient fallen in last 6 months?  No falls since surgery.  LIVING ENVIRONMENT: Lives in: House/apartment Has following equipment at home:  Bilateral axillary crutches and left CAM boot.  PLOF: Independent  PATIENT GOALS: Walk and get around without pain. OBJECTIVE: :    07/19/23:  Nustep level 3 x 13 minutes f/b P/AROM x 10 minutes to patient's left ankle f/b vasopneumatic x 15 minutes.                                                                                                    07-16-23  EXERCISE LOG   LT ankle  Exercise Repetitions and Resistance Comments  Seated Rocker board X PF/DF and EV/Inv x 4 mins ASO off  Seated Dyna disc X  6 mins CW/CCW ASO OFF  NUstep L1 x 13  mins seat 9 ASO donned           Blank cell = exercise not performed today  Manual PROM/AAROM LT ankle   Df and eversion, Achilles stretching Vaso x 10 mins on Low pressure LT foot      TREATMENT DATE Note: Objective measures were completed at Evaluation unless otherwise noted.   OBSERVATION:  Scabbed over left lateral ligament area.  She is currently on an antibiotic.  EDEMA:  Circumferential: 3 cms greater on left than right.    Localized edema in area of left lateral malleolus and scabbed over.    LOWER EXTREMITY ROM:  Patient performed active left ankle dorsiflexion with knee in full extension to 5 degrees, inversion to 8 degrees, eversion to 5  degrees and plantarflexion to 38 degrees.  LOWER EXTREMITY MMT:  NT.   GAIT: Patient presented to clinic NWBing over her left LE with a CAM boot donned and bilateral axillary crutches.  She will bring in her ASO next visit.   PATIENT EDUCATION:  Education details: We discussed gait progression. Person educated: Patient Education method: Explanation Education comprehension: verbalized understanding  HOME EXERCISE PROGRAM:   ASSESSMENT:  CLINICAL IMPRESSION: Patient returned to doctor.  Scab removed and bandaid over left lateral ankle region.  She tolerated treatment without complaint today.    OBJECTIVE IMPAIR MENTS: Abnormal gait, decreased activity tolerance, difficulty walking, decreased ROM, increased edema, and pain.   ACTIVITY LIMITATIONS:   PARTICIPATION LIMITATIONS: meal prep, cleaning, laundry, shopping, and community activity  PERSONAL FACTORS: Time since onset of injury/illness/exacerbation are also affecting patient's functional outcome.   REHAB POTENTIAL: Excellent  CLINICAL DECISION MAKING: Stable/uncomplicated  EVALUATION COMPLEXITY: Moderate   GOALS: LONG TERM GOALS: Target date: 08/09/23.  Ind with an HEP.  Goal status: MET  2.  Increase left ankle dorsiflexion to 8-10 degrees to normalize the patient's gait pattern.  Goal status: On going  3.  Increase left ankle strength to 4+ to 5/5 to increase stability for functional tasks.  Goal status: On going  4.  Walk a community distance with pain  not > 3/10.  Goal status: On going  5.  Perform ADL's with pain not > 3/10.  Goal status: On going  PLAN:  PT FREQUENCY: 2x/week  PT DURATION: 6 weeks  PLANNED INTERVENTIONS: 97110-Therapeutic exercises, 97530- Therapeutic activity, [id number]- Neuromuscular re-education, 97535- Self Care, 40981- Manual therapy, 97014- Electrical stimulation (unattended), 97016- Vasopneumatic device, 97035- Ultrasound, Patient/Family education, and Cryotherapy  PLAN  FOR NEXT SESSION: GT with left ASO donned weaning to one axillary crutch, seated BAPS and Rockerboard, review ankle pumps and ankle "ABC's."   Murielle Stang, [place], PT 07/19/2023, 4:13 PM

## 2023-07-21 ENCOUNTER — Ambulatory Visit: Payer: Medicaid Other | Admitting: Physical Therapy

## 2023-07-21 DIAGNOSIS — M25572 Pain in left ankle and joints of left foot: Secondary | ICD-10-CM | POA: Diagnosis not present

## 2023-07-21 DIAGNOSIS — R600 Localized edema: Secondary | ICD-10-CM

## 2023-07-21 NOTE — Therapy (Signed)
OUTPATIENT PHYSICAL THERAPY LOWER EXTREMITY TREATMENT   Patient Name: Christina Murillo MRN: [id number] DOB:[birth year], 47 y.o., female Today's Date: 07/21/2023  END OF SESSION:  PT End of Session - 07/21/23 1311     Visit Number 7    Number of Visits 12    Date for PT Re-Evaluation 08/09/23    PT Start Time 0102    PT Stop Time 0148    PT Time Calculation (min) 46 min    Activity Tolerance Patient tolerated treatment well    Behavior During Therapy [facility] for tasks assessed/performed             Past Medical History:  Diagnosis Date   Anxiety    Asthma    Past Surgical History:  Procedure Laterality Date   ABDOMINAL HYSTERECTOMY     ANKLE ARTHROSCOPY Right 05/03/2023   Procedure: ANKLE ARTHROSCOPY WITH OPEN BROSTROM PROCEDURE;  Surgeon: Netta Cedars, MD;  Location: [facility];  Service: Orthopedics;  Laterality: Right;   REPAIR OF PERONEUS BREVIS TENDON Right 05/03/2023   Procedure: EXPLORATION WITH DEBRIDEMENT AND BROSTRUM PROCEDURE;  Surgeon: Netta Cedars, MD;  Location: [facility];  Service: Orthopedics;  Laterality: Right;  General and Regional 90 MIN   Patient Active Problem List   Diagnosis Date Noted   Low back pain 10/07/2020   PTSD (post-traumatic stress disorder) 08/26/2020   Chronic tension-type headache, not intractable 06/27/2019   Adhesive capsulitis of left shoulder 06/06/2017   Cigarette nicotine dependence without complication 05/29/2016   GAD (generalized anxiety disorder) 08/03/2013    REFERRING PROVIDER: Netta Cedars MD  REFERRING DIAG: Sprain of ligament, left ankle.  S/p left ankle arthroscopic surgery.  THERAPY DIAG:  Pain in left ankle and joints of left foot  Localized edema  Rationale for Evaluation and Treatment: Rehabilitation  ONSET DATE: 11/2022.  SUBJECTIVE:   SUBJECTIVE STATEMENT: Pain about a 5.  Doctor removed scab.  Thinks I have nerve damage.  PERTINENT HISTORY: H/o  LBP. PAIN:  Are you having pain? Yes: NPRS scale: 8-9/10 Pain location: Left lateral ankle. Pain description: Burn, shooting. Aggravating factors: Moving, pressure, trying to stand. Relieving factors: Elevation.  PRECAUTIONS: Other: Progress per lateral ligament repair protocol.  Okay to be in an ASO and wbat with appropriate assistive device.    WEIGHT BEARING RESTRICTIONS: Yes Per patient she can progress with wbat with a left ASO donned.  FALLS:  Has patient fallen in last 6 months?  No falls since surgery.  LIVING ENVIRONMENT: Lives in: House/apartment Has following equipment at home:  Bilateral axillary crutches and left CAM boot.  PLOF: Independent  PATIENT GOALS: Walk and get around without pain. OBJECTIVE: :    07/21/23:                                     EXERCISE LOG  Exercise Repetitions and Resistance Comments  Nustep Level 3 x 15 minutes   Rockerboard in parallel bars 4 minutes   Airex pad in parallel bars 6 minutes (weight shifts).           vasopneumatic x 15 minutes.       07/19/23:  Nustep level 3 x 13 minutes f/b P/AROM x 10 minutes to patient's left ankle f/b vasopneumatic x 15 minutes.  07-16-23                                    EXERCISE LOG   LT ankle  Exercise Repetitions and Resistance Comments  Seated Rocker board X PF/DF and EV/Inv x 4 mins ASO off  Seated Dyna disc X  6 mins CW/CCW ASO OFF  NUstep L1 x 13  mins seat 9 ASO donned           Blank cell = exercise not performed today  Manual PROM/AAROM LT ankle   Df and eversion, Achilles stretching Vaso x 10 mins on Low pressure LT foot      TREATMENT DATE Note: Objective measures were completed at Evaluation unless otherwise noted.   OBSERVATION:  Scabbed over left lateral ligament area.  She is currently on an antibiotic.  EDEMA:  Circumferential: 3 cms greater on left  than right.    Localized edema in area of left lateral malleolus and scabbed over.    LOWER EXTREMITY ROM:  Patient performed active left ankle dorsiflexion with knee in full extension to 5 degrees, inversion to 8 degrees, eversion to 5 degrees and plantarflexion to 38 degrees.  LOWER EXTREMITY MMT:  NT.   GAIT: Patient presented to clinic NWBing over her left LE with a CAM boot donned and bilateral axillary crutches.  She will bring in her ASO next visit.   PATIENT EDUCATION:  Education details: We discussed gait progression. Person educated: Patient Education method: Explanation Education comprehension: verbalized understanding  HOME EXERCISE PROGRAM:   ASSESSMENT:  CLINICAL IMPRESSION: Patient did very well with the addition of Rockerboard and Airex pad with UE support.   OBJECTIVE IMPAIR MENTS: Abnormal gait, decreased activity tolerance, difficulty walking, decreased ROM, increased edema, and pain.   ACTIVITY LIMITATIONS:   PARTICIPATION LIMITATIONS: meal prep, cleaning, laundry, shopping, and community activity  PERSONAL FACTORS: Time since onset of injury/illness/exacerbation are also affecting patient's functional outcome.   REHAB POTENTIAL: Excellent  CLINICAL DECISION MAKING: Stable/uncomplicated  EVALUATION COMPLEXITY: Moderate   GOALS: LONG TERM GOALS: Target date: 08/09/23.  Ind with an HEP.  Goal status: MET  2.  Increase left ankle dorsiflexion to 8-10 degrees to normalize the patient's gait pattern.  Goal status: On going  3.  Increase left ankle strength to 4+ to 5/5 to increase stability for functional tasks.  Goal status: On going  4.  Walk a community distance with pain  not > 3/10.  Goal status: On going  5.  Perform ADL's with pain not > 3/10.  Goal status: On going  PLAN:  PT FREQUENCY: 2x/week  PT DURATION: 6 weeks  PLANNED INTERVENTIONS: 97110-Therapeutic exercises, 97530- Therapeutic activity, [id number]- Neuromuscular  re-education, 97535- Self Care, 95284- Manual therapy, 97014- Electrical stimulation (unattended), 97016- Vasopneumatic device, 97035- Ultrasound, Patient/Family education, and Cryotherapy  PLAN FOR NEXT SESSION: GT with left ASO donned weaning to one axillary crutch, seated BAPS and Rockerboard, review ankle pumps and ankle "ABC's."   Hiep Ollis, [place], PT 07/21/2023, 2:25 PM

## 2023-07-27 ENCOUNTER — Ambulatory Visit: Payer: Medicaid Other | Attending: Orthopaedic Surgery | Admitting: Physical Therapy

## 2023-07-27 ENCOUNTER — Encounter: Payer: Self-pay | Admitting: Physical Therapy

## 2023-07-27 DIAGNOSIS — M25672 Stiffness of left ankle, not elsewhere classified: Secondary | ICD-10-CM | POA: Diagnosis present

## 2023-07-27 DIAGNOSIS — M6281 Muscle weakness (generalized): Secondary | ICD-10-CM | POA: Diagnosis present

## 2023-07-27 DIAGNOSIS — M25572 Pain in left ankle and joints of left foot: Secondary | ICD-10-CM | POA: Insufficient documentation

## 2023-07-27 DIAGNOSIS — R600 Localized edema: Secondary | ICD-10-CM | POA: Diagnosis present

## 2023-07-27 NOTE — Therapy (Signed)
OUTPATIENT PHYSICAL THERAPY LOWER EXTREMITY TREATMENT   Patient Name: Christina Murillo MRN: [id number] DOB:[birth year], 47 y.o., female Today's Date: 07/27/2023  END OF SESSION:  PT End of Session - 07/27/23 1314     Visit Number 8    Number of Visits 12    Date for PT Re-Evaluation 08/09/23    PT Start Time 0103    PT Stop Time 0157    PT Time Calculation (min) 54 min    Activity Tolerance Patient tolerated treatment well    Behavior During Therapy [facility] for tasks assessed/performed             Past Medical History:  Diagnosis Date   Anxiety    Asthma    Past Surgical History:  Procedure Laterality Date   ABDOMINAL HYSTERECTOMY     ANKLE ARTHROSCOPY Right 05/03/2023   Procedure: ANKLE ARTHROSCOPY WITH OPEN BROSTROM PROCEDURE;  Surgeon: Netta Cedars, MD;  Location: [facility];  Service: Orthopedics;  Laterality: Right;   REPAIR OF PERONEUS BREVIS TENDON Right 05/03/2023   Procedure: EXPLORATION WITH DEBRIDEMENT AND BROSTRUM PROCEDURE;  Surgeon: Netta Cedars, MD;  Location: [facility];  Service: Orthopedics;  Laterality: Right;  General and Regional 90 MIN   Patient Active Problem List   Diagnosis Date Noted   Low back pain 10/07/2020   PTSD (post-traumatic stress disorder) 08/26/2020   Chronic tension-type headache, not intractable 06/27/2019   Adhesive capsulitis of left shoulder 06/06/2017   Cigarette nicotine dependence without complication 05/29/2016   GAD (generalized anxiety disorder) 08/03/2013    REFERRING PROVIDER: Netta Cedars MD  REFERRING DIAG: Sprain of ligament, left ankle.  S/p left ankle arthroscopic surgery.  THERAPY DIAG:  Pain in left ankle and joints of left foot  Localized edema  Stiffness of left ankle, not elsewhere classified  Rationale for Evaluation and Treatment: Rehabilitation  ONSET DATE: 11/2022.  SUBJECTIVE:   SUBJECTIVE STATEMENT: Pain staying about the same.  PERTINENT  HISTORY: H/o LBP. PAIN:  Are you having pain? Yes: NPRS scale: 5/10 Pain location: Left lateral ankle. Pain description: Burn, shooting. Aggravating factors: Moving, pressure, trying to stand. Relieving factors: Elevation.  PRECAUTIONS: Other: Progress per lateral ligament repair protocol.  Okay to be in an ASO and wbat with appropriate assistive device.    WEIGHT BEARING RESTRICTIONS: Yes Per patient she can progress with wbat with a left ASO donned.  FALLS:  Has patient fallen in last 6 months?  No falls since surgery.  LIVING ENVIRONMENT: Lives in: House/apartment Has following equipment at home:  Bilateral axillary crutches and left CAM boot.  PLOF: Independent  PATIENT GOALS: Walk and get around without pain. OBJECTIVE: :    07/27/23:                                       EXERCISE LOG  Exercise Repetitions and Resistance Comments  Nustep  Level 3 x 15 minutes   Standing Rockerboard In parallel bars x 4 minutes.   Airex balance pad  In parallel bars with UE support x 4 minutes   Seated dynadisc All direction x 4 minutes        vasopneumatic x 15 minutes with left LE elevation.     07/21/23:  EXERCISE LOG  Exercise Repetitions and Resistance Comments  Nustep Level 3 x 15 minutes   Rockerboard in parallel bars 4 minutes   Airex pad in parallel bars 6 minutes (weight shifts).           vasopneumatic x 15 minutes.       07/19/23:  Nustep level 3 x 13 minutes f/b P/AROM x 10 minutes to patient's left ankle f/b vasopneumatic x 15 minutes.                                                                                                    07-16-23                                    EXERCISE LOG   LT ankle  Exercise Repetitions and Resistance Comments  Seated Rocker board X PF/DF and EV/Inv x 4 mins ASO off  Seated Dyna disc X  6 mins CW/CCW ASO OFF  NUstep L1 x 13  mins seat 9 ASO donned           Blank cell =  exercise not performed today  Manual PROM/AAROM LT ankle   Df and eversion, Achilles stretching Vaso x 10 mins on Low pressure LT foot      TREATMENT DATE Note: Objective measures were completed at Evaluation unless otherwise noted.   OBSERVATION:  Scabbed over left lateral ligament area.  She is currently on an antibiotic.  EDEMA:  Circumferential: 3 cms greater on left than right.    Localized edema in area of left lateral malleolus and scabbed over.    LOWER EXTREMITY ROM:  Patient performed active left ankle dorsiflexion with knee in full extension to 5 degrees, inversion to 8 degrees, eversion to 5 degrees and plantarflexion to 38 degrees.  LOWER EXTREMITY MMT:  NT.   GAIT: Patient presented to clinic NWBing over her left LE with a CAM boot donned and bilateral axillary crutches.  She will bring in her ASO next visit.   PATIENT EDUCATION:  Education details: We discussed gait progression. Person educated: Patient Education method: Explanation Education comprehension: verbalized understanding  HOME EXERCISE PROGRAM:   ASSESSMENT:  CLINICAL IMPRESSION: Patient progressing but remains on bilateral axillary crutches.  She did very well with treatment today and tolerated without complaint.   OBJECTIVE IMPAIR MENTS: Abnormal gait, decreased activity tolerance, difficulty walking, decreased ROM, increased edema, and pain.   ACTIVITY LIMITATIONS:   PARTICIPATION LIMITATIONS: meal prep, cleaning, laundry, shopping, and community activity  PERSONAL FACTORS: Time since onset of injury/illness/exacerbation are also affecting patient's functional outcome.   REHAB POTENTIAL: Excellent  CLINICAL DECISION MAKING: Stable/uncomplicated  EVALUATION COMPLEXITY: Moderate   GOALS: LONG TERM GOALS: Target date: 08/09/23.  Ind with an HEP.  Goal status: MET  2.  Increase left ankle dorsiflexion to 8-10 degrees to normalize the patient's gait pattern.  Goal status: On  going  3.  Increase left ankle strength to 4+ to 5/5 to increase stability for functional tasks.  Goal status:  On going  4.  Walk a community distance with pain  not > 3/10.  Goal status: On going  5.  Perform ADL's with pain not > 3/10.  Goal status: On going  PLAN:  PT FREQUENCY: 2x/week  PT DURATION: 6 weeks  PLANNED INTERVENTIONS: 97110-Therapeutic exercises, 97530- Therapeutic activity, [id number]- Neuromuscular re-education, 97535- Self Care, [id number]- Manual therapy, 97014- Electrical stimulation (unattended), 97016- Vasopneumatic device, 97035- Ultrasound, Patient/Family education, and Cryotherapy  PLAN FOR NEXT SESSION: GT with left ASO donned weaning to one axillary crutch, seated BAPS and Rockerboard, review ankle pumps and ankle "ABC's."   Haili Donofrio, [place], PT 07/27/2023, 2:22 PM

## 2023-07-29 ENCOUNTER — Ambulatory Visit: Payer: Medicaid Other

## 2023-07-29 DIAGNOSIS — M25572 Pain in left ankle and joints of left foot: Secondary | ICD-10-CM | POA: Diagnosis not present

## 2023-07-29 DIAGNOSIS — R600 Localized edema: Secondary | ICD-10-CM

## 2023-07-29 DIAGNOSIS — M25672 Stiffness of left ankle, not elsewhere classified: Secondary | ICD-10-CM

## 2023-07-29 NOTE — Therapy (Signed)
OUTPATIENT PHYSICAL THERAPY LOWER EXTREMITY TREATMENT   Patient Name: Christina Murillo MRN: [id number] DOB:August 06, 1976, 47 y.o., female Today's Date: 07/29/2023  END OF SESSION:  PT End of Session - 07/29/23 1433     Visit Number 9    Number of Visits 12    Date for PT Re-Evaluation 08/09/23    PT Start Time 1430    PT Stop Time 1525    PT Time Calculation (min) 55 min    Activity Tolerance Patient tolerated treatment well    Behavior During Therapy WFL for tasks assessed/performed             Past Medical History:  Diagnosis Date   Anxiety    Asthma    Past Surgical History:  Procedure Laterality Date   ABDOMINAL HYSTERECTOMY     ANKLE ARTHROSCOPY Right 05/03/2023   Procedure: ANKLE ARTHROSCOPY WITH OPEN BROSTROM PROCEDURE;  Surgeon: Netta Cedars, MD;  Location: [facility];  Service: Orthopedics;  Laterality: Right;   REPAIR OF PERONEUS BREVIS TENDON Right 05/03/2023   Procedure: EXPLORATION WITH DEBRIDEMENT AND BROSTRUM PROCEDURE;  Surgeon: Netta Cedars, MD;  Location: [facility];  Service: Orthopedics;  Laterality: Right;  General and Regional 90 MIN   Patient Active Problem List   Diagnosis Date Noted   Low back pain 10/07/2020   PTSD (post-traumatic stress disorder) 08/26/2020   Chronic tension-type headache, not intractable 06/27/2019   Adhesive capsulitis of left shoulder 06/06/2017   Cigarette nicotine dependence without complication 05/29/2016   GAD (generalized anxiety disorder) 08/03/2013    REFERRING PROVIDER: Netta Cedars MD  REFERRING DIAG: Sprain of ligament, left ankle.  S/p left ankle arthroscopic surgery.  THERAPY DIAG:  Pain in left ankle and joints of left foot  Localized edema  Stiffness of left ankle, not elsewhere classified  Rationale for Evaluation and Treatment: Rehabilitation  ONSET DATE: 11/2022.  SUBJECTIVE:   SUBJECTIVE STATEMENT:  Pt reporting 6/10 left ankle pain today.    PERTINENT HISTORY: H/o LBP. PAIN:  Are you having pain? Yes: NPRS scale: 6/10 Pain location: Left lateral ankle. Pain description: Burn, shooting. Aggravating factors: Moving, pressure, trying to stand. Relieving factors: Elevation.  PRECAUTIONS: Other: Progress per lateral ligament repair protocol.  Okay to be in an ASO and wbat with appropriate assistive device.    WEIGHT BEARING RESTRICTIONS: Yes Per patient she can progress with wbat with a left ASO donned.  FALLS:  Has patient fallen in last 6 months?  No falls since surgery.  LIVING ENVIRONMENT: Lives in: House/apartment Has following equipment at home:  Bilateral axillary crutches and left CAM boot.  PLOF: Independent  PATIENT GOALS: Walk and get around without pain. OBJECTIVE: :    07/29/23:                                     EXERCISE LOG  Exercise Repetitions and Resistance Comments  Nustep  Level 3 x 15 minutes   Standing Rockerboard In parallel bars x 5 minutes.   Airex balance pad  In parallel bars with UE support x 5 minutes   Seated dynadisc All direction x 5 minutes   Seated heel/toe raises 30 reps each   Towel Stretch 5 reps x 30 sec    vasopneumatic x 15 minutes with left LE elevation.   07/21/23:  EXERCISE LOG  Exercise Repetitions and Resistance Comments  Nustep Level 3 x 15 minutes   Rockerboard in parallel bars 4 minutes   Airex pad in parallel bars 6 minutes (weight shifts).           vasopneumatic x 15 minutes.    07/19/23:  Nustep level 3 x 13 minutes f/b P/AROM x 10 minutes to patient's left ankle f/b vasopneumatic x 15 minutes.                                                                                                 TREATMENT DATE Note: Objective measures were completed at Evaluation unless otherwise noted.   OBSERVATION:  Scabbed over left lateral ligament area.  She is currently on an antibiotic.  EDEMA:  Circumferential: 3 cms greater on  left than right.    Localized edema in area of left lateral malleolus and scabbed over.    LOWER EXTREMITY ROM:  Patient performed active left ankle dorsiflexion with knee in full extension to 5 degrees, inversion to 8 degrees, eversion to 5 degrees and plantarflexion to 38 degrees.  LOWER EXTREMITY MMT:  NT. GAIT: Patient presented to clinic NWBing over her left LE with a CAM boot donned and bilateral axillary crutches.  She will bring in her ASO next visit.   PATIENT EDUCATION:  Education details: We discussed gait progression. Person educated: Patient Education method: Explanation Education comprehension: verbalized understanding  HOME EXERCISE PROGRAM:   ASSESSMENT:  CLINICAL IMPRESSION:  Pt arrives for today's treatment session reporting 6/10 left ankle pain.  Pt able to tolerate increased time with all exercises performed today.  Pt instructed on de-sensitization methods as the nerves in her ankle are becoming hyper sensitive.  Normal responses to vaso noted upon removal.  PT reported decreased pain at completion of today's treatment session.   OBJECTIVE IMPAIR MENTS: Abnormal gait, decreased activity tolerance, difficulty walking, decreased ROM, increased edema, and pain.   ACTIVITY LIMITATIONS:   PARTICIPATION LIMITATIONS: meal prep, cleaning, laundry, shopping, and community activity  PERSONAL FACTORS: Time since onset of injury/illness/exacerbation are also affecting patient's functional outcome.   REHAB POTENTIAL: Excellent  CLINICAL DECISION MAKING: Stable/uncomplicated  EVALUATION COMPLEXITY: Moderate   GOALS: LONG TERM GOALS: Target date: 08/09/23.  Ind with an HEP.  Goal status: MET  2.  Increase left ankle dorsiflexion to 8-10 degrees to normalize the patient's gait pattern.  Goal status: On going  3.  Increase left ankle strength to 4+ to 5/5 to increase stability for functional tasks.  Goal status: On going  4.  Walk a community distance with  pain  not > 3/10.  Goal status: On going  5.  Perform ADL's with pain not > 3/10.  Goal status: On going  PLAN:  PT FREQUENCY: 2x/week  PT DURATION: 6 weeks  PLANNED INTERVENTIONS: 97110-Therapeutic exercises, 97530- Therapeutic activity, [id number]- Neuromuscular re-education, 97535- Self Care, [id number]- Manual therapy, 97014- Electrical stimulation (unattended), 97016- Vasopneumatic device, 97035- Ultrasound, Patient/Family education, and Cryotherapy  PLAN FOR NEXT SESSION: GT with left ASO donned weaning to one axillary crutch, seated BAPS and Rockerboard, review  ankle pumps and ankle "ABC's."   Newman Pies, PTA 07/29/2023, 3:48 PM

## 2023-08-02 ENCOUNTER — Ambulatory Visit: Payer: Medicaid Other

## 2023-08-02 DIAGNOSIS — R600 Localized edema: Secondary | ICD-10-CM

## 2023-08-02 DIAGNOSIS — M25572 Pain in left ankle and joints of left foot: Secondary | ICD-10-CM | POA: Diagnosis not present

## 2023-08-02 DIAGNOSIS — M25672 Stiffness of left ankle, not elsewhere classified: Secondary | ICD-10-CM

## 2023-08-02 NOTE — Therapy (Signed)
OUTPATIENT PHYSICAL THERAPY LOWER EXTREMITY TREATMENT   Patient Name: Christina Murillo MRN: [id number] DOB:1976/04/12, 47 y.o., female Today's Date: 08/02/2023  END OF SESSION:  PT End of Session - 08/02/23 1355     Visit Number 10    Number of Visits 12    Date for PT Re-Evaluation 08/09/23    PT Start Time 1351    PT Stop Time 1445    PT Time Calculation (min) 54 min    Activity Tolerance Patient tolerated treatment well    Behavior During Therapy WFL for tasks assessed/performed             Past Medical History:  Diagnosis Date   Anxiety    Asthma    Past Surgical History:  Procedure Laterality Date   ABDOMINAL HYSTERECTOMY     ANKLE ARTHROSCOPY Right 05/03/2023   Procedure: ANKLE ARTHROSCOPY WITH OPEN BROSTROM PROCEDURE;  Surgeon: Netta Cedars, MD;  Location: [facility];  Service: Orthopedics;  Laterality: Right;   REPAIR OF PERONEUS BREVIS TENDON Right 05/03/2023   Procedure: EXPLORATION WITH DEBRIDEMENT AND BROSTRUM PROCEDURE;  Surgeon: Netta Cedars, MD;  Location: [facility];  Service: Orthopedics;  Laterality: Right;  General and Regional 90 MIN   Patient Active Problem List   Diagnosis Date Noted   Low back pain 10/07/2020   PTSD (post-traumatic stress disorder) 08/26/2020   Chronic tension-type headache, not intractable 06/27/2019   Adhesive capsulitis of left shoulder 06/06/2017   Cigarette nicotine dependence without complication 05/29/2016   GAD (generalized anxiety disorder) 08/03/2013    REFERRING PROVIDER: Netta Cedars MD  REFERRING DIAG: Sprain of ligament, left ankle.  S/p left ankle arthroscopic surgery.  THERAPY DIAG:  Pain in left ankle and joints of left foot  Localized edema  Stiffness of left ankle, not elsewhere classified  Rationale for Evaluation and Treatment: Rehabilitation  ONSET DATE: 11/2022.  SUBJECTIVE:   SUBJECTIVE STATEMENT:  Pt reporting 7-8/10 left ankle and calf pain. Pt  unable to identify cause of increase in pain.   PERTINENT HISTORY: H/o LBP. PAIN:  Are you having pain? Yes: NPRS scale: 7-8/10 Pain location: Left lateral ankle. Pain description: Burn, shooting. Aggravating factors: Moving, pressure, trying to stand. Relieving factors: Elevation.  PRECAUTIONS: Other: Progress per lateral ligament repair protocol.  Okay to be in an ASO and wbat with appropriate assistive device.    WEIGHT BEARING RESTRICTIONS: Yes Per patient she can progress with wbat with a left ASO donned.  FALLS:  Has patient fallen in last 6 months?  No falls since surgery.  LIVING ENVIRONMENT: Lives in: House/apartment Has following equipment at home:  Bilateral axillary crutches and left CAM boot.  PLOF: Independent  PATIENT GOALS: Walk and get around without pain. OBJECTIVE: :    08/02/23:                                     EXERCISE LOG  Exercise Repetitions and Resistance Comments  Nustep  Level 4 x 17 minutes   Seated Rockerboard 3 mins each   Airex balance pad     Seated dynadisc All direction x 5 minutes   Seated heel/toe raises    Towel Stretch      Modalities  Date:  Unattended Estim: Ankle, IFC 80-150 Hz, 15 mins, Pain Vaso: Ankle, 34 degrees; low pressure, 15 mins, Pain and Edema  07/21/23:                                   EXERCISE LOG  Exercise Repetitions and Resistance Comments  Nustep Level 3 x 15 minutes   Rockerboard in parallel bars 4 minutes   Airex pad in parallel bars 6 minutes (weight shifts).           vasopneumatic x 15 minutes.                                          TREATMENT DATE Note: Objective measures were completed at Evaluation unless otherwise noted.   OBSERVATION:  Scabbed over left lateral ligament area.  She is currently on an antibiotic.  EDEMA:  Circumferential: 3 cms greater on left than right.    Localized edema in area of left lateral malleolus and scabbed over.     LOWER EXTREMITY ROM:  Patient performed active left ankle dorsiflexion with knee in full extension to 5 degrees, inversion to 8 degrees, eversion to 5 degrees and plantarflexion to 38 degrees.  LOWER EXTREMITY MMT:  NT. GAIT: Patient presented to clinic NWBing over her left LE with a CAM boot donned and bilateral axillary crutches.  She will bring in her ASO next visit.   PATIENT EDUCATION:  Education details: We discussed gait progression. Person educated: Patient Education method: Explanation Education comprehension: verbalized understanding  HOME EXERCISE PROGRAM:   ASSESSMENT:  CLINICAL IMPRESSION:  Pt arrives for today's treatment session reporting 7-8/10 left ankle pain.  Due to increased pain today all exercises performed while seated.  Normal responses to estim and vaso noted upon removal.  Pt reported 5/10 left ankle pain at completion of today's treatment session.   OBJECTIVE IMPAIR MENTS: Abnormal gait, decreased activity tolerance, difficulty walking, decreased ROM, increased edema, and pain.   ACTIVITY LIMITATIONS:   PARTICIPATION LIMITATIONS: meal prep, cleaning, laundry, shopping, and community activity  PERSONAL FACTORS: Time since onset of injury/illness/exacerbation are also affecting patient's functional outcome.   REHAB POTENTIAL: Excellent  CLINICAL DECISION MAKING: Stable/uncomplicated  EVALUATION COMPLEXITY: Moderate   GOALS: LONG TERM GOALS: Target date: 08/09/23.  Ind with an HEP.  Goal status: MET  2.  Increase left ankle dorsiflexion to 8-10 degrees to normalize the patient's gait pattern.  Goal status: On going  3.  Increase left ankle strength to 4+ to 5/5 to increase stability for functional tasks.  Goal status: On going  4.  Walk a community distance with pain  not > 3/10.  Goal status: On going  5.  Perform ADL's with pain not > 3/10.  Goal status: On going  PLAN:  PT FREQUENCY: 2x/week  PT DURATION: 6  weeks  PLANNED INTERVENTIONS: 97110-Therapeutic exercises, 97530- Therapeutic activity, [id number]- Neuromuscular re-education, 97535- Self Care, [id number]- Manual therapy, 97014- Electrical stimulation (unattended), 97016- Vasopneumatic device, 97035- Ultrasound, Patient/Family education, and Cryotherapy  PLAN FOR NEXT SESSION: GT with left ASO donned weaning to one axillary crutch, seated BAPS and Rockerboard, review ankle pumps and ankle "ABC's."   Newman Pies, PTA 08/02/2023, 3:11 PM

## 2023-08-03 NOTE — Therapy (Addendum)
OUTPATIENT PHYSICAL THERAPY FEMALE PELVIC EVALUATION   Patient Name: Christina Murillo MRN: [id number] DOB:15-May-1976, 47 y.o., female Today's Date: 08/06/2023  END OF SESSION:  PT End of Session - 08/06/23 1033     Visit Number 1    Number of Visits 6    Date for PT Re-Evaluation 09/10/23    Authorization Type [employer] medicaid-auth put in.    Progress Note Due on Visit 6    PT Start Time 0930    PT Stop Time 1012    PT Time Calculation (min) 42 min    Activity Tolerance Patient tolerated treatment well    Behavior During Therapy WFL for tasks assessed/performed             Past Medical History:  Diagnosis Date   Anxiety    Asthma    Past Surgical History:  Procedure Laterality Date   ABDOMINAL HYSTERECTOMY     ANKLE ARTHROSCOPY Right 05/03/2023   Procedure: ANKLE ARTHROSCOPY WITH OPEN BROSTROM PROCEDURE;  Surgeon: Netta Cedars, MD;  Location: [facility];  Service: Orthopedics;  Laterality: Right;   REPAIR OF PERONEUS BREVIS TENDON Right 05/03/2023   Procedure: EXPLORATION WITH DEBRIDEMENT AND BROSTRUM PROCEDURE;  Surgeon: Netta Cedars, MD;  Location: [facility];  Service: Orthopedics;  Laterality: Right;  General and Regional 90 MIN   Patient Active Problem List   Diagnosis Date Noted   Low back pain 10/07/2020   PTSD (post-traumatic stress disorder) 08/26/2020   Chronic tension-type headache, not intractable 06/27/2019   Adhesive capsulitis of left shoulder 06/06/2017   Cigarette nicotine dependence without complication 05/29/2016   GAD (generalized anxiety disorder) 08/03/2013    PCP: Ladora Daniel, PA-C  REFERRING PROVIDER: Ladora Daniel, PA-C  REFERRING DIAG:  N39.46 (ICD-10-CM) - Mixed incontinence THERAPY DIAG:   N39.46 (ICD-10-CM) - Mixed incontinence Rationale for Evaluation and Treatment: Rehabilitation  ONSET DATE:   SUBJECTIVE:                                                                                                                                                                                            SUBJECTIVE STATEMENT: PT states that she had a back procedure last year and she went to stand up and had a significant amount of urine that was lost with no sensation.  She mentioned this to her back surgeon who stated that this would have nothing to do with her back surgery.  It seemed to get better but then she was shopping for a Christmas tree and thought that she should go to the bathroom but before she could get close to the  restroom she wet herself.  Pt states that she wears a pad if she is going to be away from the bathroom for an hour.  She will leak if she has to come up from a squatted position.  Pt states that she goes to the restroom approximately once an hour .    Fluid intake: No   PAIN:  Are you having pain? No   PRECAUTIONS: None   WEIGHT BEARING RESTRICTIONS: No  FALLS:  Has patient fallen in last 6 months? No OCCUPATION: unemployed- pt has recently fell in her yard and had to have surgey on her Lt ankle   PLOF: Independent  PATIENT GOALS: to not leak as much as she has   PERTINENT HISTORY:  Hysterectomy 2007, back surgery 20023 BOWEL MOVEMENT: Pain with bowel movement: No URINATION: Pain with urination: No Fully empty bladder: Yes:   Stream: Strong Urgency: Yes:   Frequency: PT tends to go to the restroom once an hour  Leakage: Walking to the bathroom, Coughing, Sneezing, Laughing, Exercise, and Bending forward Pads: Yes: when going somewhere due to ankle pt is not going places very often / PREGNANCY: Vaginal deliveries 3 Tearing No- had to have an episiotomy    PROLAPSE: None   OBJECTIVE:  Note: Objective measures were completed at Evaluation unless otherwise noted.   COGNITION: Overall cognitive status: Within functional limits for tasks assessed    Kegal hold: long hold:  2 minutes  Lower abdominal  2+/5    TODAY'S TREATMENT:                                                                                                                               DATE: 08/06/23  EVAL  HEP   PATIENT EDUCATION:  Education details: Engineer, site, lower abdominal contraction Person educated: Patient Education method: Explanation Education comprehension: verbalized understanding  HOME EXERCISE PROGRAM: Kegal long hold x 2:00 complete 10 x per day Short hold hold 2" relax 4" repeat x 10 reps do 3 x a day Lower pelvic contraction hold 10" reps x 10 complete 3x   ASSESSMENT:  CLINICAL IMPRESSION: Patient is a 47 y.o. female who was seen today for physical therapy evaluation and treatment for mixed incontinence. Ms Raudabaugh is unable to be away from a restroom longer than an hour, if she must be at this time she will wear a pad due to urine leakage.  Ms. Bonvillain will benefit from skilled PT to address her pelvic floor and abdominal weakness to improve her continence.    OBJECTIVE IMPAIRMENTS: decreased strength.   ACTIVITY LIMITATIONS: carrying, lifting, bending, squatting, continence, and hygiene/grooming  PARTICIPATION LIMITATIONS: cleaning, driving, shopping, and community activity  PERSONAL FACTORS: 1-2 comorbidities: back surgery and hysterectomy  are also affecting patient's functional outcome.   REHAB POTENTIAL: Good  CLINICAL DECISION MAKING: Stable/uncomplicated  EVALUATION COMPLEXITY: Low   GOALS: Goals reviewed with patient? No  SHORT TERM GOALS: Target date: 07/26/24  PT to  be able to be away from a bathroom for two hours without having an incontinence issue. Baseline: Goal status: INITIAL  2.  PT to be I in HEP in order to strengthen her pelvic floor mm to improve  her incontinence.  Baseline:  Goal status: INITIAL  LONG TERM GOALS: Target date: 09/17/23  PT to be able to be away from a bathroom for 3 hours without having an incontinence issue.  Baseline:  Goal status: INITIAL  2.  PT to be able to squat  down and return from squatted position without having incontinence  Baseline:  Goal status: INITIAL   PLAN:  PT FREQUENCY: 1x/week  PT DURATION: 6 weeks  PLANNED INTERVENTIONS: 97110-Therapeutic exercises and 84696- Self Care  PLAN FOR NEXT SESSION: Begin heel slide,SLR,  side lying hip abduction and prone hip extension with pelvic floor engaged.    Virgina Organ, PT CLT [phone]  08/06/2023, 10:35 AM  [employer] Medicare Auth Request Information  Date of referral: 07/30/23 Referring provider: Ladora Daniel  Referring diagnosis (ICD 10)? N39.46 Treatment diagnosis (ICD 10)? (if different than referring diagnosis) M62.81  Functional Tool Score: incontinence if pt has not been to the bathroom for over one hour.   What was this (referring dx) caused by? Other: birthing 3 children, hysterectomy and back surgery weakening pelvic floor.   Nature of Condition: Chronic (continuous duration > 3 months)   Laterality: Both    Objective measurements identify impairments when they are compared to normal values, the uninvolved extremity, and prior level of function.  [x]  Yes  []  No  Objective assessment of functional ability: Minimal functional limitations   Briefly describe symptoms: incontinence with  lifting, laughing, squatting, sneezing, urge to go.   How did symptoms start: gradual  Average pain intensity: none    How often does the pt experience symptoms? Frequently  How much have the symptoms interfered with usual daily activities? A little bit  How has condition changed since care began at this facility? NA - initial visit  In general, how is the patients overall health? Very Good   BACK PAIN (STarT Back Screening Tool) No

## 2023-08-04 ENCOUNTER — Encounter: Payer: Self-pay | Admitting: *Deleted

## 2023-08-04 ENCOUNTER — Ambulatory Visit: Payer: Medicaid Other | Admitting: *Deleted

## 2023-08-04 DIAGNOSIS — M25572 Pain in left ankle and joints of left foot: Secondary | ICD-10-CM | POA: Diagnosis not present

## 2023-08-04 DIAGNOSIS — M25672 Stiffness of left ankle, not elsewhere classified: Secondary | ICD-10-CM

## 2023-08-04 DIAGNOSIS — R600 Localized edema: Secondary | ICD-10-CM

## 2023-08-04 NOTE — Therapy (Signed)
OUTPATIENT PHYSICAL THERAPY LOWER EXTREMITY TREATMENT   Patient Name: Christina Murillo MRN: [id number] DOB:[birth year], 47 y.o., female Today's Date: 08/04/2023  END OF SESSION:  PT End of Session - 08/04/23 1352     Visit Number 11    Number of Visits 12    Date for PT Re-Evaluation 08/09/23    PT Start Time 1345             Past Medical History:  Diagnosis Date   Anxiety    Asthma    Past Surgical History:  Procedure Laterality Date   ABDOMINAL HYSTERECTOMY     ANKLE ARTHROSCOPY Right 05/03/2023   Procedure: ANKLE ARTHROSCOPY WITH OPEN BROSTROM PROCEDURE;  Surgeon: Netta Cedars, MD;  Location: [facility];  Service: Orthopedics;  Laterality: Right;   REPAIR OF PERONEUS BREVIS TENDON Right 05/03/2023   Procedure: EXPLORATION WITH DEBRIDEMENT AND BROSTRUM PROCEDURE;  Surgeon: Netta Cedars, MD;  Location: [facility];  Service: Orthopedics;  Laterality: Right;  General and Regional 90 MIN   Patient Active Problem List   Diagnosis Date Noted   Low back pain 10/07/2020   PTSD (post-traumatic stress disorder) 08/26/2020   Chronic tension-type headache, not intractable 06/27/2019   Adhesive capsulitis of left shoulder 06/06/2017   Cigarette nicotine dependence without complication 05/29/2016   GAD (generalized anxiety disorder) 08/03/2013    REFERRING PROVIDER: Netta Cedars MD  REFERRING DIAG: Sprain of ligament, left ankle.  S/p left ankle arthroscopic surgery.  THERAPY DIAG:  Pain in left ankle and joints of left foot  Localized edema  Stiffness of left ankle, not elsewhere classified  Rationale for Evaluation and Treatment: Rehabilitation  ONSET DATE: 11/2022.  SUBJECTIVE:   SUBJECTIVE STATEMENT:  Pt reporting 7-8/10 left ankle and calf pain.Unable to tolerate increased WB to one crutch yet. Its still very sensitive. To MD Friday  PERTINENT HISTORY: H/o LBP. PAIN:  Are you having pain? Yes: NPRS scale:  7-8/10 Pain location: Left lateral ankle. Pain description: Burn, shooting. Aggravating factors: Moving, pressure, trying to stand. Relieving factors: Elevation.  PRECAUTIONS: Other: Progress per lateral ligament repair protocol.  Okay to be in an ASO and wbat with appropriate assistive device.    WEIGHT BEARING RESTRICTIONS: Yes Per patient she can progress with wbat with a left ASO donned.  FALLS:  Has patient fallen in last 6 months?  No falls since surgery.  LIVING ENVIRONMENT: Lives in: House/apartment Has following equipment at home:  Bilateral axillary crutches and left CAM boot.  PLOF: Independent  PATIENT GOALS: Walk and get around without pain. OBJECTIVE: :    08/04/23:                                     EXERCISE LOG         LT ankle  Exercise Repetitions and Resistance Comments  Nustep  Level 4 x 17 minutes      Seated Rockerboard each PF/DF, Pron/Sup.    Airex balance pad     Seated dynadisc All direction x 6 minutes   Seated heel/toe raises    Towel Stretch      Manual PROM LT ankle all motions,  AROM DF neutral    PF 40   EV 5  INV  15    Modalities  Date:  Unattended Estim: Ankle, IFC 80-150 Hz, 15 mins, Pain Vaso: Ankle, 34 degrees; low pressure, 15 mins, Pain and  Edema                                     07/21/23:                                   EXERCISE LOG  Exercise Repetitions and Resistance Comments  Nustep Level 3 x 15 minutes   Rockerboard in parallel bars 4 minutes   Airex pad in parallel bars 6 minutes (weight shifts).           vasopneumatic x 15 minutes.                                          TREATMENT DATE Note: Objective measures were completed at Evaluation unless otherwise noted.   OBSERVATION:  Scabbed over left lateral ligament area.  She is currently on an antibiotic.  EDEMA:  Circumferential: 3 cms greater on left than right.    Localized edema in area of left lateral malleolus and scabbed over.    LOWER  EXTREMITY ROM:  Patient performed active left ankle dorsiflexion with knee in full extension to 5 degrees, inversion to 8 degrees, eversion to 5 degrees and plantarflexion to 38 degrees.  LOWER EXTREMITY MMT:  NT. GAIT: Patient presented to clinic NWBing over her left LE with a CAM boot donned and bilateral axillary crutches.  She will bring in her ASO next visit.   PATIENT EDUCATION:  Education details: We discussed gait progression. Person educated: Patient Education method: Explanation Education comprehension: verbalized understanding  HOME EXERCISE PROGRAM:   ASSESSMENT:  CLINICAL IMPRESSION:  Pt arrives for today's treatment session reporting 7-8/10 left ankle pain.  Due to increased pain still, all exercises performed while seated. She was able to tolerate increased time on exs and had ROM improvements. Normal responses to estim and vaso noted upon removal.  Pt reported 5/10 left ankle pain at completion of today's treatment session. To MD Friday. Still unable to progress WB due to high pain levels   OBJECTIVE IMPAIR MENTS: Abnormal gait, decreased activity tolerance, difficulty walking, decreased ROM, increased edema, and pain.   ACTIVITY LIMITATIONS:   PARTICIPATION LIMITATIONS: meal prep, cleaning, laundry, shopping, and community activity  PERSONAL FACTORS: Time since onset of injury/illness/exacerbation are also affecting patient's functional outcome.   REHAB POTENTIAL: Excellent  CLINICAL DECISION MAKING: Stable/uncomplicated  EVALUATION COMPLEXITY: Moderate   GOALS: LONG TERM GOALS: Target date: 08/09/23.  Ind with an HEP.  Goal status: MET  2.  Increase left ankle dorsiflexion to 8-10 degrees to normalize the patient's gait pattern.  Goal status: On going   DF to neutral  3.  Increase left ankle strength to 4+ to 5/5 to increase stability for functional tasks.  Goal status: On going  4.  Walk a community distance with pain  not > 3/10.  Goal status:  On going    7-8/10  5.  Perform ADL's with pain not > 3/10.  Goal status: On going    7-8/10  PLAN:  PT FREQUENCY: 2x/week  PT DURATION: 6 weeks  PLANNED INTERVENTIONS: 97110-Therapeutic exercises, 97530- Therapeutic activity, [id number]- Neuromuscular re-education, 97535- Self Care, 16109- Manual therapy, 97014- Electrical stimulation (unattended), 97016- Vasopneumatic device, 60454- Ultrasound, Patient/Family education, and Cryotherapy  PLAN FOR  NEXT SESSION: GT with left ASO donned weaning to one axillary crutch, seated BAPS and Rockerboard, review ankle pumps and ankle "ABC's."   Coleta Grosshans,CHRIS, PTA 08/04/2023, 2:45 PM

## 2023-08-06 ENCOUNTER — Ambulatory Visit (HOSPITAL_COMMUNITY): Payer: Medicaid Other | Attending: Physician Assistant | Admitting: Physical Therapy

## 2023-08-06 ENCOUNTER — Other Ambulatory Visit: Payer: Self-pay

## 2023-08-06 DIAGNOSIS — M6281 Muscle weakness (generalized): Secondary | ICD-10-CM | POA: Diagnosis present

## 2023-08-06 DIAGNOSIS — N3946 Mixed incontinence: Secondary | ICD-10-CM | POA: Diagnosis present

## 2023-08-06 NOTE — Addendum Note (Signed)
Addended by: Bella Kennedy on: 08/06/2023 10:45 AM   Modules accepted: Orders

## 2023-08-09 ENCOUNTER — Encounter: Payer: Medicaid Other | Admitting: Physical Therapy

## 2023-08-10 ENCOUNTER — Ambulatory Visit: Payer: Medicaid Other | Admitting: Physical Therapy

## 2023-08-10 ENCOUNTER — Encounter: Payer: Self-pay | Admitting: Physical Therapy

## 2023-08-10 DIAGNOSIS — M25572 Pain in left ankle and joints of left foot: Secondary | ICD-10-CM | POA: Diagnosis not present

## 2023-08-10 DIAGNOSIS — R600 Localized edema: Secondary | ICD-10-CM

## 2023-08-10 DIAGNOSIS — M25672 Stiffness of left ankle, not elsewhere classified: Secondary | ICD-10-CM

## 2023-08-10 NOTE — Therapy (Signed)
OUTPATIENT PHYSICAL THERAPY LOWER EXTREMITY TREATMENT   Patient Name: Christina Murillo MRN: [id number] DOB:06/06/1976, 47 y.o., female Today's Date: 08/10/2023  END OF SESSION:  PT End of Session - 08/10/23 1328     Visit Number 12    Number of Visits 12    Date for PT Re-Evaluation 08/10/23    PT Start Time 0100    PT Stop Time 0159    PT Time Calculation (min) 59 min    Activity Tolerance Patient tolerated treatment well    Behavior During Therapy [facility] for tasks assessed/performed             Past Medical History:  Diagnosis Date   Anxiety    Asthma    Past Surgical History:  Procedure Laterality Date   ABDOMINAL HYSTERECTOMY     ANKLE ARTHROSCOPY Right 05/03/2023   Procedure: ANKLE ARTHROSCOPY WITH OPEN BROSTROM PROCEDURE;  Surgeon: Netta Cedars, MD;  Location: [facility];  Service: Orthopedics;  Laterality: Right;   REPAIR OF PERONEUS BREVIS TENDON Right 05/03/2023   Procedure: EXPLORATION WITH DEBRIDEMENT AND BROSTRUM PROCEDURE;  Surgeon: Netta Cedars, MD;  Location: [facility];  Service: Orthopedics;  Laterality: Right;  General and Regional 90 MIN   Patient Active Problem List   Diagnosis Date Noted   Low back pain 10/07/2020   PTSD (post-traumatic stress disorder) 08/26/2020   Chronic tension-type headache, not intractable 06/27/2019   Adhesive capsulitis of left shoulder 06/06/2017   Cigarette nicotine dependence without complication 05/29/2016   GAD (generalized anxiety disorder) 08/03/2013    REFERRING PROVIDER: Netta Cedars MD  REFERRING DIAG: Sprain of ligament, left ankle.  S/p left ankle arthroscopic surgery.  THERAPY DIAG:  Pain in left ankle and joints of left foot - Plan: PT plan of care cert/re-cert  Localized edema - Plan: PT plan of care cert/re-cert  Stiffness of left ankle, not elsewhere classified - Plan: PT plan of care cert/re-cert  Rationale for Evaluation and Treatment:  Rehabilitation  ONSET DATE: 11/2022.  SUBJECTIVE:   SUBJECTIVE STATEMENT:  Larey Seat going from crutches to knee scooter because left leg gave way again.   PERTINENT HISTORY: H/o LBP. PAIN:  Are you having pain? Yes: NPRS scale: 7-8/10 Pain location: Left lateral ankle. Pain description: Burn, shooting. Aggravating factors: Moving, pressure, trying to stand. Relieving factors: Elevation.  PRECAUTIONS: Other: Progress per lateral ligament repair protocol.  Okay to be in an ASO and wbat with appropriate assistive device.    WEIGHT BEARING RESTRICTIONS: Yes Per patient she can progress with wbat with a left ASO donned.  FALLS:  Has patient fallen in last 6 months?  No falls since surgery.  LIVING ENVIRONMENT: Lives in: House/apartment Has following equipment at home:  Bilateral axillary crutches and left CAM boot.  PLOF: Independent  PATIENT GOALS: Walk and get around without pain. OBJECTIVE: :   08/09/23  Nustep level 4 x 18 minutes f/b seated Rockerboard x 5 minutes f/n LE elevation and IFC and vasopneumatic to patient's left ankle x 20 minutes.   08/04/23:                                     EXERCISE LOG         LT ankle  Exercise Repetitions and Resistance Comments  Nustep  Level 4 x 17 minutes      Seated Rockerboard each PF/DF, Pron/Sup.    Airex balance  pad     Seated dynadisc All direction x 6 minutes   Seated heel/toe raises    Towel Stretch      Manual PROM LT ankle all motions,  AROM DF neutral    PF 40   EV 5  INV  15    Modalities  Date:  Unattended Estim: Ankle, IFC 80-150 Hz, 15 mins, Pain Vaso: Ankle, 34 degrees; low pressure, 15 mins, Pain and Edema                                     07/21/23:                                   EXERCISE LOG  Exercise Repetitions and Resistance Comments  Nustep Level 3 x 15 minutes   Rockerboard in parallel bars 4 minutes   Airex pad in parallel bars 6 minutes (weight shifts).           vasopneumatic x 15  minutes.                                          TREATMENT DATE Note: Objective measures were completed at Evaluation unless otherwise noted.   OBSERVATION:  Scabbed over left lateral ligament area.  She is currently on an antibiotic.  EDEMA:  Circumferential: 3 cms greater on left than right.    Localized edema in area of left lateral malleolus and scabbed over.    LOWER EXTREMITY ROM:  Patient performed active left ankle dorsiflexion with knee in full extension to 5 degrees, inversion to 8 degrees, eversion to 5 degrees and plantarflexion to 38 degrees.  LOWER EXTREMITY MMT:  NT. GAIT: Patient presented to clinic NWBing over her left LE with a CAM boot donned and bilateral axillary crutches.  She will bring in her ASO next visit.   PATIENT EDUCATION:  Education details: We discussed gait progression. Person educated: Patient Education method: Explanation Education comprehension: verbalized understanding  HOME EXERCISE PROGRAM:   ASSESSMENT:  CLINICAL IMPRESSION:  Patient fell while going from crutches to knee scooter because her left knee gave way.  Plan is to have one more visit and finalize her HEP and then discharge to a HEP.  We discussed patient being evaluated again early next year in PT.   OBJECTIVE IMPAIR MENTS: Abnormal gait, decreased activity tolerance, difficulty walking, decreased ROM, increased edema, and pain.   ACTIVITY LIMITATIONS:   PARTICIPATION LIMITATIONS: meal prep, cleaning, laundry, shopping, and community activity  PERSONAL FACTORS: Time since onset of injury/illness/exacerbation are also affecting patient's functional outcome.   REHAB POTENTIAL: Excellent  CLINICAL DECISION MAKING: Stable/uncomplicated  EVALUATION COMPLEXITY: Moderate   GOALS: LONG TERM GOALS: Target date: 08/10/23.  Ind with an HEP.  Goal status: MET  2.  Increase left ankle dorsiflexion to 8-10 degrees to normalize the patient's gait pattern.  Goal status:  On going   DF to neutral  3.  Increase left ankle strength to 4+ to 5/5 to increase stability for functional tasks.  Goal status: On going  4.  Walk a community distance with pain  not > 3/10.  Goal status: On going    7-8/10  5.  Perform ADL's with pain not > 3/10.  Goal  status: On going    7-8/10  PLAN:  PT FREQUENCY: 2x/week  PT DURATION: 6 weeks  PLANNED INTERVENTIONS: 97110-Therapeutic exercises, 97530- Therapeutic activity, [id number]- Neuromuscular re-education, 97535- Self Care, 21308- Manual therapy, 97014- Electrical stimulation (unattended), 97016- Vasopneumatic device, 97035- Ultrasound, Patient/Family education, and Cryotherapy  PLAN FOR NEXT SESSION: GT with left ASO donned weaning to one axillary crutch, seated BAPS and Rockerboard, review ankle pumps and ankle "ABC's."   Taden Witter, [place], PT 08/10/2023, 2:26 PM

## 2023-08-11 ENCOUNTER — Ambulatory Visit: Payer: Medicaid Other

## 2023-08-11 DIAGNOSIS — M6281 Muscle weakness (generalized): Secondary | ICD-10-CM

## 2023-08-11 DIAGNOSIS — M25572 Pain in left ankle and joints of left foot: Secondary | ICD-10-CM

## 2023-08-11 DIAGNOSIS — M25672 Stiffness of left ankle, not elsewhere classified: Secondary | ICD-10-CM

## 2023-08-11 DIAGNOSIS — R600 Localized edema: Secondary | ICD-10-CM

## 2023-08-11 NOTE — Therapy (Addendum)
OUTPATIENT PHYSICAL THERAPY LOWER EXTREMITY TREATMENT   Patient Name: Christina Murillo MRN: [id number] DOB:[birth year], 47 y.o., female Today's Date: 08/11/2023  END OF SESSION:  PT End of Session - 08/11/23 1352     Visit Number 13    Number of Visits 12    Date for PT Re-Evaluation 08/10/23    PT Start Time 1350    PT Stop Time 1444    PT Time Calculation (min) 54 min    Activity Tolerance Patient tolerated treatment well    Behavior During Therapy WFL for tasks assessed/performed             Past Medical History:  Diagnosis Date   Anxiety    Asthma    Past Surgical History:  Procedure Laterality Date   ABDOMINAL HYSTERECTOMY     ANKLE ARTHROSCOPY Right 05/03/2023   Procedure: ANKLE ARTHROSCOPY WITH OPEN BROSTROM PROCEDURE;  Surgeon: Netta Cedars, MD;  Location: [facility];  Service: Orthopedics;  Laterality: Right;   REPAIR OF PERONEUS BREVIS TENDON Right 05/03/2023   Procedure: EXPLORATION WITH DEBRIDEMENT AND BROSTRUM PROCEDURE;  Surgeon: Netta Cedars, MD;  Location: [facility];  Service: Orthopedics;  Laterality: Right;  General and Regional 90 MIN   Patient Active Problem List   Diagnosis Date Noted   Low back pain 10/07/2020   PTSD (post-traumatic stress disorder) 08/26/2020   Chronic tension-type headache, not intractable 06/27/2019   Adhesive capsulitis of left shoulder 06/06/2017   Cigarette nicotine dependence without complication 05/29/2016   GAD (generalized anxiety disorder) 08/03/2013    REFERRING PROVIDER: Netta Cedars MD  REFERRING DIAG: Sprain of ligament, left ankle.  S/p left ankle arthroscopic surgery.  THERAPY DIAG:  Pain in left ankle and joints of left foot  Localized edema  Stiffness of left ankle, not elsewhere classified  Muscle weakness (generalized)  Rationale for Evaluation and Treatment: Rehabilitation  ONSET DATE: 11/2022.  SUBJECTIVE:   SUBJECTIVE STATEMENT:  Pt reports 6/10  left ankle pain today.    PERTINENT HISTORY: H/o LBP. PAIN:  Are you having pain? Yes: NPRS scale: 6/10 Pain location: Left lateral ankle. Pain description: Burn, shooting. Aggravating factors: Moving, pressure, trying to stand. Relieving factors: Elevation.  PRECAUTIONS: Other: Progress per lateral ligament repair protocol.  Okay to be in an ASO and wbat with appropriate assistive device.    WEIGHT BEARING RESTRICTIONS: Yes Per patient she can progress with wbat with a left ASO donned.  FALLS:  Has patient fallen in last 6 months?  No falls since surgery.  LIVING ENVIRONMENT: Lives in: House/apartment Has following equipment at home:  Bilateral axillary crutches and left CAM boot.  PLOF: Independent  PATIENT GOALS: Walk and get around without pain. OBJECTIVE: :   08/11/23:                                     EXERCISE LOG         LT ankle  Exercise Repetitions and Resistance Comments  Nustep  Level 4 x 17 minutes      Seated Rockerboard each PF/DF, Pron/Sup.    Airex balance pad     Seated dynadisc All direction x 6 minutes   Four Way Ankle Yellow x 15 reps each   Towel Stretch 3 reps x 30 sec        Modalities  Date:  Unattended Estim: Ankle, IFC 80-150 Hz, 15 mins, Pain Vaso:  Ankle, 34 degrees; low pressure, 15 mins, Pain and Edema                                     07/21/23:                                   EXERCISE LOG  Exercise Repetitions and Resistance Comments  Nustep Level 3 x 15 minutes   Rockerboard in parallel bars 4 minutes   Airex pad in parallel bars 6 minutes (weight shifts).           vasopneumatic x 15 minutes.                                          TREATMENT DATE Note: Objective measures were completed at Evaluation unless otherwise noted.   OBSERVATION:  Scabbed over left lateral ligament area.  She is currently on an antibiotic.  EDEMA:  Circumferential: 3 cms greater on left than right.    Localized edema in area of left  lateral malleolus and scabbed over.    LOWER EXTREMITY ROM:  Patient performed active left ankle dorsiflexion with knee in full extension to 5 degrees, inversion to 8 degrees, eversion to 5 degrees and plantarflexion to 38 degrees.  LOWER EXTREMITY MMT:  NT. GAIT: Patient presented to clinic NWBing over her left LE with a CAM boot donned and bilateral axillary crutches.  She will bring in her ASO next visit.   PATIENT EDUCATION:  Education details: We discussed gait progression. Person educated: Patient Education method: Explanation Education comprehension: verbalized understanding  HOME EXERCISE PROGRAM: [email or web address]  Access Code: [email or web address]  ASSESSMENT:  CLINICAL IMPRESSION:  Pt arrives for today's treatment session reporting 6/10 left ankle pain.  Pt instructed in 4 way ankle thera-band exercises with min cues for proper technique.  Pt reports performing her exercises at home, but has made little progress towards her other goals as her progress has been limited by pain.  Pt given updated HEP and thera-band for home use.  Pt encouraged to call the facility with any questions or concerns.  Normal responses to estim and MH noted upon removal.  Pt reported decrease pain at completion of today's treatment session.  Pt ready for discharge at this time.     OBJECTIVE IMPAIR MENTS: Abnormal gait, decreased activity tolerance, difficulty walking, decreased ROM, increased edema, and pain.   ACTIVITY LIMITATIONS:   PARTICIPATION LIMITATIONS: meal prep, cleaning, laundry, shopping, and community activity  PERSONAL FACTORS: Time since onset of injury/illness/exacerbation are also affecting patient's functional outcome.   REHAB POTENTIAL: Excellent  CLINICAL DECISION MAKING: Stable/uncomplicated  EVALUATION COMPLEXITY: Moderate   GOALS: LONG TERM GOALS: Target date: 08/10/23.  Ind with an HEP.  Goal status: MET  2.  Increase left ankle dorsiflexion to  8-10 degrees to normalize the patient's gait pattern.  Goal status: On going   DF to neutral  3.  Increase left ankle strength to 4+ to 5/5 to increase stability for functional tasks.  Goal status: On going  4.  Walk a community distance with pain  not > 3/10.  Goal status: On going    7-8/10  5.  Perform ADL's with pain not > 3/10.  Goal status: On going  7-8/10  PLAN:  PT FREQUENCY: 2x/week  PT DURATION: 6 weeks  PLANNED INTERVENTIONS: 97110-Therapeutic exercises, 97530- Therapeutic activity, [id number]- Neuromuscular re-education, 97535- Self Care, 16109- Manual therapy, 97014- Electrical stimulation (unattended), 97016- Vasopneumatic device, 97035- Ultrasound, Patient/Family education, and Cryotherapy  PLAN FOR NEXT SESSION: GT with left ASO donned weaning to one axillary crutch, seated BAPS and Rockerboard, review ankle pumps and ankle "ABC's."   Newman Pies, PTA 08/11/2023, 4:15 PM

## 2023-08-12 ENCOUNTER — Encounter (HOSPITAL_COMMUNITY): Payer: Medicaid Other | Admitting: Physical Therapy

## 2023-08-23 ENCOUNTER — Encounter (HOSPITAL_COMMUNITY): Payer: Medicaid Other | Admitting: Physical Therapy

## 2023-08-23 ENCOUNTER — Telehealth (HOSPITAL_COMMUNITY): Payer: Self-pay | Admitting: Physical Therapy

## 2023-08-23 NOTE — Telephone Encounter (Signed)
Called pt who stated that she was told that she had met her 27 max visits for the year and would need to wait for 2025 to continue with both her ankle and her pelvic treatment.   Virgina Organ, PT CLT [phone]

## 2023-08-31 ENCOUNTER — Encounter (HOSPITAL_COMMUNITY): Payer: Medicaid Other | Admitting: Physical Therapy

## 2023-09-02 ENCOUNTER — Telehealth: Payer: Medicaid Other | Admitting: Physician Assistant

## 2023-09-02 DIAGNOSIS — K047 Periapical abscess without sinus: Secondary | ICD-10-CM

## 2023-09-02 MED ORDER — AMOXICILLIN-POT CLAVULANATE 875-125 MG PO TABS
1.0000 | ORAL_TABLET | Freq: Two times a day (BID) | ORAL | 0 refills | Status: AC
Start: 2023-09-02 — End: ?

## 2023-09-02 NOTE — Progress Notes (Signed)

## 2023-09-02 NOTE — Progress Notes (Signed)
 I have spent 5 minutes in review of e-visit questionnaire, review and updating patient chart, medical decision making and response to patient.   Piedad Climes, PA-C

## 2023-09-08 ENCOUNTER — Ambulatory Visit (HOSPITAL_COMMUNITY): Payer: Medicaid Other | Attending: Physician Assistant | Admitting: Physical Therapy

## 2023-09-08 DIAGNOSIS — M6281 Muscle weakness (generalized): Secondary | ICD-10-CM | POA: Insufficient documentation

## 2023-09-08 DIAGNOSIS — N3946 Mixed incontinence: Secondary | ICD-10-CM | POA: Insufficient documentation

## 2023-09-08 NOTE — Therapy (Signed)
 OUTPATIENT PHYSICAL THERAPY FEMALE PELVIC Treatment   Patient Name: Christina Murillo MRN: [id number] DOB:1976/04/13, 48 y.o., female Today's Date: 09/08/2023  END OF SESSION:  PT End of Session - 09/08/23 1056     Visit Number 2    Number of Visits 6    Date for PT Re-Evaluation 09/10/23    Authorization Type 27 visits total pt is getting ankle rehab in [place]    Authorization Time Period [facility] medicaid    Authorization - Number of Visits 2    Progress Note Due on Visit 6    PT Start Time 1015    PT Stop Time 1045    PT Time Calculation (min) 30 min    Activity Tolerance Patient tolerated treatment well    Behavior During Therapy WFL for tasks assessed/performed              Past Medical History:  Diagnosis Date   Anxiety    Asthma    Past Surgical History:  Procedure Laterality Date   ABDOMINAL HYSTERECTOMY     ANKLE ARTHROSCOPY Right 05/03/2023   Procedure: ANKLE ARTHROSCOPY WITH OPEN BROSTROM PROCEDURE;  Surgeon: Ali Ink, MD;  Location: [facility];  Service: Orthopedics;  Laterality: Right;   REPAIR OF PERONEUS BREVIS TENDON Right 05/03/2023   Procedure: EXPLORATION WITH DEBRIDEMENT AND BROSTRUM PROCEDURE;  Surgeon: Ali Ink, MD;  Location: [facility];  Service: Orthopedics;  Laterality: Right;  General and Regional 90 MIN   Patient Active Problem List   Diagnosis Date Noted   Low back pain 10/07/2020   PTSD (post-traumatic stress disorder) 08/26/2020   Chronic tension-type headache, not intractable 06/27/2019   Adhesive capsulitis of left shoulder 06/06/2017   Cigarette nicotine dependence without complication 05/29/2016   GAD (generalized anxiety disorder) 08/03/2013    PCP: Gerrianne Krauss, PA-C  REFERRING PROVIDER: Gerrianne Krauss, PA-C  REFERRING DIAG:  N39.46 (ICD-10-CM) - Mixed incontinence THERAPY DIAG:   N39.46 (ICD-10-CM) - Mixed incontinence Rationale for Evaluation and Treatment: Rehabilitation  ONSET  DATE:   SUBJECTIVE:                                                                                                                                                                                           SUBJECTIVE STATEMENT: PT states that she has been doing her kegals.  She states that she is not sure if she is seeing any benefit at this time. Pt states that she got an MRI which did show pinched nerves.   Pt states that she wears a pad if she is going to be away from the bathroom for an hour.  She will leak if she has to come up from a squatted position.  Pt states that she goes to the restroom approximately once an hour .    Fluid intake: No   PAIN:  Are you having pain? No   PRECAUTIONS: None   WEIGHT BEARING RESTRICTIONS: No  FALLS:  Has patient fallen in last 6 months? No OCCUPATION: unemployed- pt has recently fell in her yard and had to have surgey on her Lt ankle   PLOF: Independent  PATIENT GOALS: to not leak as much as she has   PERTINENT HISTORY:  Hysterectomy 2007, back surgery 20023 BOWEL MOVEMENT: Pain with bowel movement: No URINATION: Pain with urination: No Fully empty bladder: Yes:   Stream: Strong Urgency: Yes:   Frequency: PT tends to go to the restroom once an hour  Leakage: Walking to the bathroom, Coughing, Sneezing, Laughing, Exercise, and Bending forward Pads: Yes: when going somewhere due to ankle pt is not going places very often / PREGNANCY: Vaginal deliveries 3 Tearing No- had to have an episiotomy    PROLAPSE: None   OBJECTIVE:  Note: Objective measures were completed at Evaluation unless otherwise noted.   COGNITION: Overall cognitive status: Within functional limits for tasks assessed    Kegal hold: long hold:  2 minutes  Lower abdominal  2+/5    TODAY'S TREATMENT:                                                                                                                              DATE: 09/08/23 Reviewed goals and  evaluation POE x 1 minutes Standing extension x 5 Supine: Heel slide x 10 B Side lying:  Hip abduction with core engaged x 10B Prone hip extension with 2 pillows under abdominal x 10    08/06/23  EVAL  HEP   PATIENT EDUCATION:  Education details: Engineer, site, lower abdominal contraction Person educated: Patient Education method: Explanation Education comprehension: verbalized understanding  HOME EXERCISE PROGRAM: Kegal long hold x 2:00 complete 10 x per day Short hold hold 2" relax 4" repeat x 10 reps do 3 x a day Lower pelvic contraction hold 10" reps x 10 complete 3x   ASSESSMENT:  CLINICAL IMPRESSION: Reviewed eval and goals with pt.  PT is still having incontinence issues which is normal at this time.    Ms. Hu will continue to  benefit from skilled PT to address her pelvic floor and abdominal weakness to improve her continence.    OBJECTIVE IMPAIRMENTS: decreased strength.   ACTIVITY LIMITATIONS: carrying, lifting, bending, squatting, continence, and hygiene/grooming  PARTICIPATION LIMITATIONS: cleaning, driving, shopping, and community activity  PERSONAL FACTORS: 1-2 comorbidities: back surgery and hysterectomy  are also affecting patient's functional outcome.   REHAB POTENTIAL: Good  CLINICAL DECISION MAKING: Stable/uncomplicated  EVALUATION COMPLEXITY: Low   GOALS: Goals reviewed with patient? Yes  SHORT TERM GOALS: Target date: 07/26/24  PT to be able to be away from a bathroom for  two hours without having an incontinence issue. Baseline: Goal status: on-going  2.  PT to be I in HEP in order to strengthen her pelvic floor mm to improve  her incontinence.  Baseline:  Goal status: on-going  LONG TERM GOALS: Target date: 09/17/23  PT to be able to be away from a bathroom for 3 hours without having an incontinence issue.  Baseline:  Goal status: on-going  2.  PT to be able to squat down and return from squatted position without having incontinence   Baseline:  Goal status: on-going   PLAN:  PT FREQUENCY: 1x/week  PT DURATION: 6 weeks  PLANNED INTERVENTIONS: 97110-Therapeutic exercises and 16109- Self Care  PLAN FOR NEXT SESSION: Begin all four with core engaged as well as theraband exercises.    Leodis Rainwater, PT CLT [phone]  09/08/2023, 10:57 AM

## 2023-09-15 ENCOUNTER — Ambulatory Visit (HOSPITAL_COMMUNITY): Payer: Medicaid Other | Admitting: Physical Therapy

## 2023-09-15 DIAGNOSIS — M6281 Muscle weakness (generalized): Secondary | ICD-10-CM | POA: Diagnosis not present

## 2023-09-15 DIAGNOSIS — N3946 Mixed incontinence: Secondary | ICD-10-CM

## 2023-09-15 NOTE — Therapy (Signed)
OUTPATIENT PHYSICAL THERAPY FEMALE PELVIC Treatment/discharge   Patient Name: Christina Murillo MRN: [id number] DOB:1976/02/08, 48 y.o., female Today's Date: 09/15/2023 PHYSICAL THERAPY DISCHARGE SUMMARY  Visits from Start of Care: 3  Current functional level related to goals / functional outcomes: 50% met    Remaining deficits: Still having incontinence issues    Education / Equipment: HEP   Patient agrees to discharge. Patient goals were partially met. Patient is being discharged due to  I in HEP .  END OF SESSION:  PT End of Session - 09/15/23 1100     Visit Number 3    Number of Visits 3    Date for PT Re-Evaluation 09/10/23    Authorization Type 27 visits total pt is getting ankle rehab in [place]    Authorization Time Period [facility] medicaid    Authorization - Number of Visits 3    Progress Note Due on Visit 3    PT Start Time 1020    PT Stop Time 1100    PT Time Calculation (min) 40 min    Activity Tolerance Patient tolerated treatment well    Behavior During Therapy WFL for tasks assessed/performed               Past Medical History:  Diagnosis Date   Anxiety    Asthma    Past Surgical History:  Procedure Laterality Date   ABDOMINAL HYSTERECTOMY     ANKLE ARTHROSCOPY Right 05/03/2023   Procedure: ANKLE ARTHROSCOPY WITH OPEN BROSTROM PROCEDURE;  Surgeon: Netta Cedars, MD;  Location: [facility];  Service: Orthopedics;  Laterality: Right;   REPAIR OF PERONEUS BREVIS TENDON Right 05/03/2023   Procedure: EXPLORATION WITH DEBRIDEMENT AND BROSTRUM PROCEDURE;  Surgeon: Netta Cedars, MD;  Location: [facility];  Service: Orthopedics;  Laterality: Right;  General and Regional 90 MIN   Patient Active Problem List   Diagnosis Date Noted   Low back pain 10/07/2020   PTSD (post-traumatic stress disorder) 08/26/2020   Chronic tension-type headache, not intractable 06/27/2019   Adhesive capsulitis of left shoulder 06/06/2017    Cigarette nicotine dependence without complication 05/29/2016   GAD (generalized anxiety disorder) 08/03/2013    PCP: Ladora Daniel, PA-C  REFERRING PROVIDER: Ladora Daniel, PA-C  REFERRING DIAG:  N39.46 (ICD-10-CM) - Mixed incontinence THERAPY DIAG:   N39.46 (ICD-10-CM) - Mixed incontinence Rationale for Evaluation and Treatment: Rehabilitation  ONSET DATE:   SUBJECTIVE:  SUBJECTIVE STATEMENT: Pt states that she is able to hold her urine for three hours now.  She is still having incontinence especially when she bends forward.  She is doing her exercises.      Fluid intake: No   PAIN:  Are you having pain? No   PRECAUTIONS: None   WEIGHT BEARING RESTRICTIONS: No  FALLS:  Has patient fallen in last 6 months? No OCCUPATION: unemployed- pt has recently fell in her yard and had to have surgey on her Lt ankle   PLOF: Independent  PATIENT GOALS: to not leak as much as she has   PERTINENT HISTORY:  Hysterectomy 2007, back surgery 20023 BOWEL MOVEMENT: Pain with bowel movement: No URINATION: Pain with urination: No Fully empty bladder: Yes:   Stream: Strong Urgency: Yes:   Frequency: PT tends to go to the restroom once an hour  Leakage: Walking to the bathroom, Coughing, Sneezing, Laughing, Exercise, and Bending forward Pads: Yes: when going somewhere due to ankle pt is not going places very often / PREGNANCY: Vaginal deliveries 3 Tearing No- had to have an episiotomy    PROLAPSE: None   OBJECTIVE:  Note: Objective measures were completed at Evaluation unless otherwise noted.   COGNITION: Overall cognitive status: Within functional limits for tasks assessed    Kegal hold: long hold:  2 minutes  Lower abdominal  2+/5    TODAY'S TREATMENT:                                                                                                                               DATE: 09/15/23 Abdominal crunch x 10 Theraband exercises:  Green  Flexion Rt x 10, Lt x 10  Golf put x 10 B Theraband over the top of the door: Rt hand to Lt hip, Lt hand to Rt hip x 10  Bhands  down to B hip x 10 Quadriped opposite arm/leg x  10  09/08/23 Reviewed goals and evaluation POE x 1 minutes Standing extension x 5 Supine: Heel slide x 10 B Side lying:  Hip abduction with core engaged x 10B Prone hip extension with 2 pillows under abdominal x 10    08/06/23  EVAL  HEP   PATIENT EDUCATION:  Education details: Engineer, site, lower abdominal contraction Person educated: Patient Education method: Explanation Education comprehension: verbalized understanding  HOME EXERCISE PROGRAM:  Eval: Kegal long hold x 2:00 complete 10 x per day Short hold hold 2" relax 4" repeat x 10 reps do 3 x a day Lower pelvic contraction hold 10" reps x 10 complete 3x  1/15: all exercises with transverse ab activation. Heel slides Side lying hip abduction Prone hip extension  09/15/23 Tband exercises as above given green theraband Abdominal crunch Quadriped opposite arm/leg  ASSESSMENT:  CLINICAL IMPRESSION: Pt has no questions with new exercises.  Pt able to wait for three hours before having to void at this time.  PT is still having incontinence issues which  is normal at this time.    Ms. Watwood will continue to  benefit from completing exercises.  Therapist explained that it can take 3-6 months before she becomes continent again.  Pt is ready for discharge at this time.    OBJECTIVE IMPAIRMENTS: decreased strength.   ACTIVITY LIMITATIONS: carrying, lifting, bending, squatting, continence, and hygiene/grooming  PARTICIPATION LIMITATIONS: cleaning, driving, shopping, and community activity  PERSONAL FACTORS: 1-2 comorbidities: back surgery and hysterectomy  are also affecting patient's functional  outcome.   REHAB POTENTIAL: Good  CLINICAL DECISION MAKING: Stable/uncomplicated  EVALUATION COMPLEXITY: Low   GOALS: Goals reviewed with patient? Yes  SHORT TERM GOALS: Target date: 07/26/24  PT to be able to be away from a bathroom for two hours without having an incontinence issue. Baseline: Goal status: met  2.  PT to be I in HEP in order to strengthen her pelvic floor mm to improve  her incontinence.  Baseline:  Goal status: on-going  LONG TERM GOALS: Target date: 09/17/23  PT to be able to be away from a bathroom for 3 hours without having an incontinence issue.  Baseline:  Goal status:met   2.  PT to be able to squat down and return from squatted position without having incontinence  Baseline:  Goal status: on-going   PLAN:  PT FREQUENCY: 1x/week  PT DURATION: 6 weeks  PLANNED INTERVENTIONS: 97110-Therapeutic exercises and 16109- Self Care  PLAN FOR NEXT SESSION: Discharge   Virgina Organ, PT CLT [phone]  09/15/2023, 11:01 AM

## 2023-10-06 ENCOUNTER — Ambulatory Visit: Payer: Medicaid Other | Admitting: Physical Therapy

## 2024-04-17 ENCOUNTER — Telehealth: Admitting: Physician Assistant

## 2024-04-17 DIAGNOSIS — R3989 Other symptoms and signs involving the genitourinary system: Secondary | ICD-10-CM

## 2024-04-17 MED ORDER — NITROFURANTOIN MONOHYD MACRO 100 MG PO CAPS
100.0000 mg | ORAL_CAPSULE | Freq: Two times a day (BID) | ORAL | 0 refills | Status: AC
Start: 2024-04-17 — End: ?

## 2024-04-17 NOTE — Progress Notes (Signed)

## 2024-05-10 ENCOUNTER — Ambulatory Visit: Admitting: Physical Therapy

## 2024-05-15 LAB — COLOGUARD: COLOGUARD: POSITIVE — AB

## 2024-09-05 ENCOUNTER — Other Ambulatory Visit: Payer: Self-pay

## 2024-09-05 ENCOUNTER — Ambulatory Visit: Attending: Physical Medicine and Rehabilitation | Admitting: Physical Therapy

## 2024-09-05 DIAGNOSIS — M25612 Stiffness of left shoulder, not elsewhere classified: Secondary | ICD-10-CM | POA: Diagnosis present

## 2024-09-05 DIAGNOSIS — M79602 Pain in left arm: Secondary | ICD-10-CM | POA: Diagnosis present

## 2024-09-05 DIAGNOSIS — M542 Cervicalgia: Secondary | ICD-10-CM | POA: Insufficient documentation

## 2024-09-05 DIAGNOSIS — M62838 Other muscle spasm: Secondary | ICD-10-CM | POA: Insufficient documentation

## 2024-09-05 DIAGNOSIS — M25512 Pain in left shoulder: Secondary | ICD-10-CM | POA: Insufficient documentation

## 2024-09-05 DIAGNOSIS — R293 Abnormal posture: Secondary | ICD-10-CM | POA: Insufficient documentation
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
## 2024-09-19 ENCOUNTER — Ambulatory Visit: Admitting: Physical Therapy

## 2024-09-19 ENCOUNTER — Encounter: Payer: Self-pay | Admitting: Physical Therapy

## 2024-09-19 DIAGNOSIS — M542 Cervicalgia: Secondary | ICD-10-CM | POA: Diagnosis not present

## 2024-09-19 DIAGNOSIS — M62838 Other muscle spasm: Secondary | ICD-10-CM

## 2024-09-19 DIAGNOSIS — M25512 Pain in left shoulder: Secondary | ICD-10-CM

## 2024-09-19 DIAGNOSIS — M25612 Stiffness of left shoulder, not elsewhere classified: Secondary | ICD-10-CM

## 2024-09-19 DIAGNOSIS — M79602 Pain in left arm: Secondary | ICD-10-CM
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
